# Patient Record
Sex: Male | Born: 1990 | Race: White | Hispanic: No | Marital: Single | Smoking: Former smoker
Health system: Southern US, Community
[De-identification: ages and names within clinical notes are randomized; demographics above are authoritative.]

## PROBLEM LIST (undated history)

## (undated) DIAGNOSIS — X838XXA Intentional self-harm by other specified means, initial encounter: Secondary | ICD-10-CM

## (undated) DIAGNOSIS — J45909 Unspecified asthma, uncomplicated: Secondary | ICD-10-CM

## (undated) DIAGNOSIS — F32A Depression, unspecified: Secondary | ICD-10-CM

## (undated) DIAGNOSIS — F329 Major depressive disorder, single episode, unspecified: Secondary | ICD-10-CM

## (undated) HISTORY — PX: LEG SURGERY: SHX1003

## (undated) HISTORY — PX: CHOLECYSTECTOMY: SHX55

---
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
## 2010-04-13 ENCOUNTER — Emergency Department (HOSPITAL_BASED_OUTPATIENT_CLINIC_OR_DEPARTMENT_OTHER): Admission: EM | Admit: 2010-04-13 | Discharge: 2010-04-13 | Payer: Self-pay | Admitting: Emergency Medicine

## 2010-04-13 ENCOUNTER — Ambulatory Visit: Payer: Self-pay | Admitting: Diagnostic Radiology

## 2010-04-30 ENCOUNTER — Emergency Department (HOSPITAL_COMMUNITY): Admission: EM | Admit: 2010-04-30 | Discharge: 2010-04-30 | Payer: Self-pay | Admitting: Emergency Medicine

## 2017-05-30 ENCOUNTER — Encounter (HOSPITAL_COMMUNITY): Payer: Self-pay | Admitting: Emergency Medicine

## 2017-05-30 ENCOUNTER — Emergency Department (HOSPITAL_COMMUNITY): Payer: BLUE CROSS/BLUE SHIELD

## 2017-05-30 ENCOUNTER — Emergency Department (HOSPITAL_COMMUNITY)
Admission: EM | Admit: 2017-05-30 | Discharge: 2017-05-30 | Disposition: A | Discharge status: Home / Self Care | Payer: BLUE CROSS/BLUE SHIELD | Attending: Emergency Medicine | Admitting: Emergency Medicine

## 2017-05-30 DIAGNOSIS — F17290 Nicotine dependence, other tobacco product, uncomplicated: Secondary | ICD-10-CM | POA: Insufficient documentation

## 2017-05-30 DIAGNOSIS — R630 Anorexia: Secondary | ICD-10-CM | POA: Insufficient documentation

## 2017-05-30 DIAGNOSIS — R1084 Generalized abdominal pain: Secondary | ICD-10-CM | POA: Insufficient documentation

## 2017-05-30 DIAGNOSIS — R110 Nausea: Secondary | ICD-10-CM | POA: Insufficient documentation

## 2017-05-30 DIAGNOSIS — K6389 Other specified diseases of intestine: Secondary | ICD-10-CM

## 2017-05-30 DIAGNOSIS — K388 Other specified diseases of appendix: Secondary | ICD-10-CM | POA: Insufficient documentation

## 2017-05-30 DIAGNOSIS — K529 Noninfective gastroenteritis and colitis, unspecified: Secondary | ICD-10-CM

## 2017-05-30 DIAGNOSIS — R109 Unspecified abdominal pain: Secondary | ICD-10-CM | POA: Diagnosis present

## 2017-05-30 HISTORY — DX: Unspecified asthma, uncomplicated: J45.909

## 2017-05-30 LAB — COMPREHENSIVE METABOLIC PANEL
ALT: 76 U/L — AB (ref 17–63)
AST: 34 U/L (ref 15–41)
Albumin: 4.3 g/dL (ref 3.5–5.0)
Alkaline Phosphatase: 50 U/L (ref 38–126)
Anion gap: 7 (ref 5–15)
BILIRUBIN TOTAL: 0.6 mg/dL (ref 0.3–1.2)
BUN: 18 mg/dL (ref 6–20)
CALCIUM: 9.3 mg/dL (ref 8.9–10.3)
CHLORIDE: 105 mmol/L (ref 101–111)
CO2: 26 mmol/L (ref 22–32)
CREATININE: 1.15 mg/dL (ref 0.61–1.24)
Glucose, Bld: 103 mg/dL — ABNORMAL HIGH (ref 65–99)
Potassium: 3.8 mmol/L (ref 3.5–5.1)
Sodium: 138 mmol/L (ref 135–145)
TOTAL PROTEIN: 7.2 g/dL (ref 6.5–8.1)

## 2017-05-30 LAB — CBC
HCT: 43.1 % (ref 39.0–52.0)
Hemoglobin: 15.1 g/dL (ref 13.0–17.0)
MCH: 30.5 pg (ref 26.0–34.0)
MCHC: 35 g/dL (ref 30.0–36.0)
MCV: 87.1 fL (ref 78.0–100.0)
PLATELETS: 152 10*3/uL (ref 150–400)
RBC: 4.95 MIL/uL (ref 4.22–5.81)
RDW: 12.3 % (ref 11.5–15.5)
WBC: 8.8 10*3/uL (ref 4.0–10.5)

## 2017-05-30 LAB — URINALYSIS, ROUTINE W REFLEX MICROSCOPIC
Bilirubin Urine: NEGATIVE
Glucose, UA: NEGATIVE mg/dL
Hgb urine dipstick: NEGATIVE
KETONES UR: NEGATIVE mg/dL
LEUKOCYTES UA: NEGATIVE
NITRITE: NEGATIVE
PROTEIN: NEGATIVE mg/dL
Specific Gravity, Urine: 1.016 (ref 1.005–1.030)
pH: 6 (ref 5.0–8.0)

## 2017-05-30 LAB — I-STAT CG4 LACTIC ACID, ED: LACTIC ACID, VENOUS: 0.74 mmol/L (ref 0.5–1.9)

## 2017-05-30 LAB — LIPASE, BLOOD: LIPASE: 30 U/L (ref 11–51)

## 2017-05-30 MED ORDER — IOPAMIDOL (ISOVUE-300) INJECTION 61%
INTRAVENOUS | Status: AC
  Filled 2017-05-30: qty 100

## 2017-05-30 MED ORDER — IOPAMIDOL (ISOVUE-300) INJECTION 61%
100.0000 mL | Freq: Once | INTRAVENOUS | Status: AC | PRN
Start: 2017-05-30 — End: 2017-05-30
  Administered 2017-05-30: 100 mL via INTRAVENOUS

## 2017-05-30 MED ORDER — OXYCODONE HCL 5 MG PO TABS: 5.0000 mg | ORAL_TABLET | ORAL | 0 refills | Status: DC | PRN

## 2017-05-30 MED ORDER — HYDROMORPHONE HCL 1 MG/ML IJ SOLN
1.0000 mg | Freq: Once | INTRAMUSCULAR | Status: AC
  Administered 2017-05-30: 1 mg via INTRAVENOUS
  Filled 2017-05-30: qty 1

## 2017-05-30 MED ORDER — ONDANSETRON HCL 4 MG/2ML IJ SOLN
4.0000 mg | Freq: Once | INTRAMUSCULAR | Status: AC
  Administered 2017-05-30: 4 mg via INTRAVENOUS
  Filled 2017-05-30: qty 2

## 2017-05-30 MED ORDER — ONDANSETRON HCL 4 MG PO TABS: 4.0000 mg | ORAL_TABLET | Freq: Three times a day (TID) | ORAL | 0 refills | Status: DC | PRN

## 2017-05-30 MED ORDER — SODIUM CHLORIDE 0.9 % IV BOLUS (SEPSIS)
1000.0000 mL | Freq: Once | INTRAVENOUS | Status: AC
  Administered 2017-05-30: 1000 mL via INTRAVENOUS

## 2017-05-30 NOTE — ED Triage Notes (Signed)
Pt states he has had lower abd pain since this morning. Denies N/V/D. Alert and oriented.

## 2017-05-30 NOTE — Discharge Instructions (Signed)
Your workup today shows evidence of epiploic appendigitis. Please use the pain and nausea medicine to help with her symptoms. Please call to follow-up with a primary care physician in the next few weeks for reassessment and reevaluation of your liver function. If any symptoms change or worsen, please return to the nearest emergency department.

## 2017-05-30 NOTE — ED Notes (Signed)
ED Provider at bedside. 

## 2017-05-30 NOTE — ED Provider Notes (Signed)
[facility]-EMERGENCY DEPT Provider Note   CSN: [id number] Arrival date & time: 05/30/17  0710     History   Chief Complaint Chief Complaint  Patient presents with  . Abdominal Pain    HPI Richard Roberson is a 26 y.o. male.  The history is provided by the patient, a friend and medical records.  Abdominal Pain   This is a new problem. The current episode started 2 days ago. The problem occurs constantly. The problem has been rapidly worsening. The pain is located in the RLQ. The quality of the pain is sharp and aching. The pain is at a severity of 10/10. The pain is severe. Associated symptoms include anorexia, nausea and constipation. Pertinent negatives include fever, diarrhea, melena, vomiting, dysuria, frequency, hematuria and headaches. The symptoms are aggravated by palpation. Nothing relieves the symptoms. Past medical history comments: prior cholecystectomy.    No past medical history on file.  There are no active problems to display for this patient.   Past Surgical History:  Procedure Laterality Date  . CHOLECYSTECTOMY    . LEG SURGERY Right        Home Medications    Prior to Admission medications   Not on File    Family History No family history on file.  Social History Social History  Substance Use Topics  . Smoking status: Current Every Day Smoker    Types: E-cigarettes  . Smokeless tobacco: Not on file  . Alcohol use Yes     Allergies   Betadine [povidone iodine]   Review of Systems Review of Systems  Constitutional: Negative for chills, diaphoresis, fatigue and fever.  HENT: Negative for congestion and rhinorrhea.   Respiratory: Negative for cough, chest tightness, shortness of breath, wheezing and stridor.   Cardiovascular: Negative for chest pain and palpitations.  Gastrointestinal: Positive for abdominal pain, anorexia, constipation and nausea. Negative for diarrhea, melena and vomiting.  Genitourinary: Negative for dysuria, flank  pain, frequency and hematuria.  Musculoskeletal: Negative for back pain, neck pain and neck stiffness.  Skin: Negative for rash and wound.  Neurological: Negative for light-headedness and headaches.  Psychiatric/Behavioral: Negative for agitation and confusion.  All other systems reviewed and are negative.    Physical Exam Updated Vital Signs BP 119/83 (BP Location: Right Arm)   Pulse 84   Temp 98.8 F (37.1 C) (Oral)   Resp 16   SpO2 95%   Physical Exam  Constitutional: He is oriented to person, place, and time. He appears well-developed and well-nourished. No distress.  HENT:  Head: Normocephalic and atraumatic.  Mouth/Throat: Oropharynx is clear and moist. No oropharyngeal exudate.  Eyes: Conjunctivae are normal.  Neck: Normal range of motion. Neck supple.  Cardiovascular: Normal rate and intact distal pulses.   No murmur heard. Pulmonary/Chest: Effort normal and breath sounds normal. No respiratory distress. He has no wheezes. He exhibits no tenderness.  Abdominal: Soft. Normal appearance. There is tenderness in the right lower quadrant. There is no rigidity and no CVA tenderness.    Musculoskeletal: He exhibits no edema or tenderness.  Neurological: He is alert and oriented to person, place, and time. No sensory deficit. He exhibits normal muscle tone.  Skin: Skin is warm and dry. Capillary refill takes less than 2 seconds. He is not diaphoretic. No erythema. No pallor.  Psychiatric: He has a normal mood and affect.  Nursing note and vitals reviewed.    ED Treatments / Results  Labs (all labs ordered are listed, but only abnormal results are  displayed) Labs Reviewed  COMPREHENSIVE METABOLIC PANEL - Abnormal; Notable for the following:       Result Value   Glucose, Bld 103 (*)    ALT 76 (*)    All other components within normal limits  URINE CULTURE  LIPASE, BLOOD  CBC  URINALYSIS, ROUTINE W REFLEX MICROSCOPIC  I-STAT CG4 LACTIC ACID, ED    EKG  EKG  Interpretation None       Radiology Ct Abdomen Pelvis W Contrast  Result Date: 05/30/2017 CLINICAL DATA:  RIGHT lower quadrant pain across lower abdomen. Syncopal episode for the spleen. Surgical history including cholecystectomy and hernia repair. EXAM: CT ABDOMEN AND PELVIS WITH CONTRAST TECHNIQUE: Multidetector CT imaging of the abdomen and pelvis was performed using the standard protocol following bolus administration of intravenous contrast. CONTRAST:  ISOVUE-300 IOPAMIDOL (ISOVUE-300) INJECTION 61% COMPARISON:  CT 01/01/2016 FINDINGS: Lower chest: Lung bases are clear. Hepatobiliary: No focal hepatic lesion. Postcholecystectomy. No biliary dilatation. Low-attenuation within liver could indicate hepatic steatosis. Pancreas: Pancreas is normal. No ductal dilatation. No pancreatic inflammation. Spleen: Normal spleen Adrenals/urinary tract: Adrenal glands and kidneys are normal. The ureters and bladder normal. Stomach/Bowel: Stomach, small-bowel, terminal ileum, and appendix are normal. The ascending, transverse and descending colon a normal. Along the anti mesenteric border of the proximal sigmoid colon there is an ovoid region of fat enhancement measuring 20 mm by 9 mm (image 69, series 2). Lesion Is also seen on coronal image 33, series 3. This finding is most suggestive of an inflamed epiploic appendage. No diverticular disease. No perforation or abscess Vascular/Lymphatic: Abdominal aorta is normal caliber. There is no retroperitoneal or periportal lymphadenopathy. No pelvic lymphadenopathy. Reproductive: Prostate normal Other: No hernia. Musculoskeletal: No aggressive osseous lesion. IMPRESSION: 1. Acute epiploic appendagitis of the proximal sigmoid colon. This is a benign self-limiting process. 2. Normal appendix. 3. Potential hepatic steatosis. Electronically Signed   By: Genevive Bi M.D.   On: 05/30/2017 10:47    Procedures Procedures (including critical care time)  Medications  Ordered in ED Medications  HYDROmorphone (DILAUDID) injection 1 mg (1 mg Intravenous Given 05/30/17 1006)  ondansetron (ZOFRAN) injection 4 mg (4 mg Intravenous Given 05/30/17 1006)  sodium chloride 0.9 % bolus 1,000 mL (0 mLs Intravenous Stopped 05/30/17 1129)  iopamidol (ISOVUE-300) 61 % injection 100 mL (100 mLs Intravenous Contrast Given 05/30/17 1027)     Initial Impression / Assessment and Plan / ED Course  I have reviewed the triage vital signs and the nursing notes.  Pertinent labs & imaging results that were available during my care of the patient were reviewed by me and considered in my medical decision making (see chart for details).     Richard Roberson is a 26 y.o. male with a past medical history significant for asthma and prior cholecystectomy who presents with right lower quadrant abdominal pain. Patient says that for the last 3 days, as he is had worsening right lower quadrant pain. He says that he has had some constipation for the last few days and had one small bowel movement but has continued to pass normal gas. He reports nausea but no vomiting. He reports decreased appetite. He is properly concerned about appendicitis. Patient says that he feels like his pain is "something exploding". He describes as a sharp and aching pain as well. He describes as a 10 out of 10 severity and nonradiating. He denies history of kidney stones. He denies any dysuria, hematuria, or foul-smelling urine. He denies recent dramatic injuries. Denies  fevers or chills or any other symptoms.  On exam, patient has right lower quadrant and suprapubic tenderness. He denies any complaint in his groin. No CVA tenderness or chest tenderness. Lungs clear.  Patient will have laboratory testing as well as urinalysis. He'll also be given pain medicine, nausea medicine, and fluids. Patient will have CT imaging to look for appendicitis versus diverticulitis versus constipation and obstruction.  Anticipate  reassessment following workup.    11:14 AM CT imaging resulted showing evidence of epiploic appendigitis. No evidence of appendicitis. Laboratory testing was reassuring aside from slight elevation ALT. Imaging also showed possible hepatic steatosis. No evidence of UTI or infection. Normal lactic acid.  Patient reported feeling better with fluids, pain medicine, nausea medicine.   Patient will be given prescription for pain medicine and nausea medicine with this acute finding.   Patient given instructions for PCP follow-up as well as return precautions for any new or worsening symptoms. Patient will be discharged and understands plan of care. Patient discharged in good condition.    Final Clinical Impressions(s) / ED Diagnoses   Final diagnoses:  Generalized abdominal pain  Epiploic appendagitis    New Prescriptions New Prescriptions   ONDANSETRON (ZOFRAN) 4 MG TABLET    Take 1 tablet (4 mg total) by mouth every 8 (eight) hours as needed for nausea or vomiting.   OXYCODONE (ROXICODONE) 5 MG IMMEDIATE RELEASE TABLET    Take 1 tablet (5 mg total) by mouth every 4 (four) hours as needed for severe pain.    Clinical Impression: 1. Generalized abdominal pain   2. Epiploic appendagitis     Disposition: Discharge  Condition: Good  I have discussed the results, Dx and Tx plan with the pt(& family if present). He/she/they expressed understanding and agree(s) with the plan. Discharge instructions discussed at great length. Strict return precautions discussed and pt &/or family have verbalized understanding of the instructions. No further questions at time of discharge.    New Prescriptions   No medications on file    Follow Up: No follow-up provider specified.     Ran Tullis, Canary Brim, MD 05/30/17 2032

## 2017-05-31 LAB — URINE CULTURE: Culture: <10000 — AB

## 2017-11-14 ENCOUNTER — Emergency Department (HOSPITAL_COMMUNITY)
Admission: EM | Admit: 2017-11-14 | Discharge: 2017-11-14 | Disposition: A | Discharge status: Home / Self Care | Payer: BLUE CROSS/BLUE SHIELD | Attending: Emergency Medicine | Admitting: Emergency Medicine

## 2017-11-14 ENCOUNTER — Encounter (HOSPITAL_COMMUNITY): Payer: Self-pay

## 2017-11-14 ENCOUNTER — Other Ambulatory Visit: Payer: Self-pay

## 2017-11-14 DIAGNOSIS — F17200 Nicotine dependence, unspecified, uncomplicated: Secondary | ICD-10-CM | POA: Insufficient documentation

## 2017-11-14 DIAGNOSIS — F329 Major depressive disorder, single episode, unspecified: Secondary | ICD-10-CM | POA: Insufficient documentation

## 2017-11-14 DIAGNOSIS — R45851 Suicidal ideations: Secondary | ICD-10-CM | POA: Insufficient documentation

## 2017-11-14 HISTORY — DX: Major depressive disorder, single episode, unspecified: F32.9

## 2017-11-14 HISTORY — DX: Intentional self-harm by other specified means, initial encounter: X83.8XXA

## 2017-11-14 HISTORY — DX: Depression, unspecified: F32.A

## 2017-11-14 LAB — COMPREHENSIVE METABOLIC PANEL
ALBUMIN: 4.2 g/dL (ref 3.5–5.0)
ALT: 107 U/L — AB (ref 17–63)
AST: 47 U/L — ABNORMAL HIGH (ref 15–41)
Alkaline Phosphatase: 57 U/L (ref 38–126)
Anion gap: 9 (ref 5–15)
BUN: 17 mg/dL (ref 6–20)
CHLORIDE: 104 mmol/L (ref 101–111)
CO2: 24 mmol/L (ref 22–32)
CREATININE: 1 mg/dL (ref 0.61–1.24)
Calcium: 9.1 mg/dL (ref 8.9–10.3)
GFR calc Af Amer: >60 mL/min (ref >60)
GFR calc non Af Amer: >60 mL/min (ref >60)
Glucose, Bld: 132 mg/dL — ABNORMAL HIGH (ref 65–99)
Potassium: 3.6 mmol/L (ref 3.5–5.1)
SODIUM: 137 mmol/L (ref 135–145)
Total Bilirubin: 0.8 mg/dL (ref 0.3–1.2)
Total Protein: 7.2 g/dL (ref 6.5–8.1)

## 2017-11-14 LAB — CBC
HCT: 44.3 % (ref 39.0–52.0)
HEMOGLOBIN: 15.4 g/dL (ref 13.0–17.0)
MCH: 30.9 pg (ref 26.0–34.0)
MCHC: 34.8 g/dL (ref 30.0–36.0)
MCV: 88.8 fL (ref 78.0–100.0)
Platelets: 152 10*3/uL (ref 150–400)
RBC: 4.99 MIL/uL (ref 4.22–5.81)
RDW: 12.7 % (ref 11.5–15.5)
WBC: 9.7 10*3/uL (ref 4.0–10.5)

## 2017-11-14 LAB — RAPID URINE DRUG SCREEN, HOSP PERFORMED
AMPHETAMINES: NOT DETECTED
Barbiturates: NOT DETECTED
Benzodiazepines: NOT DETECTED
COCAINE: NOT DETECTED
Opiates: NOT DETECTED
TETRAHYDROCANNABINOL: NOT DETECTED

## 2017-11-14 LAB — ETHANOL: Alcohol, Ethyl (B): <10 mg/dL (ref <10)

## 2017-11-14 LAB — ACETAMINOPHEN LEVEL

## 2017-11-14 LAB — SALICYLATE LEVEL: Salicylate Lvl: <7 mg/dL (ref 2.8–30.0)

## 2017-11-14 MED ORDER — HYDROXYZINE HCL 25 MG PO TABS: 25.0000 mg | ORAL_TABLET | Freq: Three times a day (TID) | ORAL | Status: DC | PRN

## 2017-11-14 MED ORDER — NICOTINE 21 MG/24HR TD PT24
21.0000 mg | MEDICATED_PATCH | Freq: Once | TRANSDERMAL | Status: DC
  Administered 2017-11-14: 21 mg via TRANSDERMAL
  Filled 2017-11-14: qty 1

## 2017-11-14 MED ORDER — HYDROXYZINE HCL 25 MG PO TABS: 25.0000 mg | ORAL_TABLET | Freq: Three times a day (TID) | ORAL | 0 refills | Status: DC | PRN

## 2017-11-14 NOTE — ED Provider Notes (Signed)
[facility]-EMERGENCY DEPT Provider Note   CSN: [id number] Arrival date & time: 11/14/17  [id number]     History   Chief Complaint Chief Complaint  Patient presents with  . Suicidal    HPI Richard BullaChristopher H Roberson is a 27 y.o. male.  Patient is a 27 year old male with past medical history of depression.  He is brought by authorities for evaluation of depression with suicidal ideation.  He reports stressors in his life including finances, job responsibilities, and relationship issues as stressors.  He states he has thought of taking a knife and cutting his wrists.  He denies any homicidal ideation.  He denies recent drug use, but does have a history of polysubstance abuse.  He admits to infrequent alcohol use.   The history is provided by the patient.    Past Medical History:  Diagnosis Date  . Asthma    as a child  . Depression   . Suicide (HCC)     There are no active problems to display for this patient.   Past Surgical History:  Procedure Laterality Date  . CHOLECYSTECTOMY    . LEG SURGERY Right        Home Medications    Prior to Admission medications   Medication Sig Start Date End Date Taking? Authorizing Provider  calcium carbonate (TUMS - DOSED IN MG ELEMENTAL CALCIUM) 500 MG chewable tablet Chew 1-2 tablets by mouth as needed for indigestion or heartburn.    [provider]  ibuprofen (ADVIL,MOTRIN) 200 MG tablet Take 800 mg by mouth every 6 (six) hours as needed for fever, headache, mild pain, moderate pain or cramping.    [provider]  ondansetron (ZOFRAN) 4 MG tablet Take 1 tablet (4 mg total) by mouth every 8 (eight) hours as needed for nausea or vomiting. 05/30/17   Tegeler, Canary Brimhristopher J, MD  oxyCODONE (ROXICODONE) 5 MG immediate release tablet Take 1 tablet (5 mg total) by mouth every 4 (four) hours as needed for severe pain. 05/30/17   Tegeler, Canary Brimhristopher J, MD    Family History History reviewed. No pertinent family  history.  Social History Social History   Tobacco Use  . Smoking status: Current Every Day Smoker    Types: E-cigarettes  . Smokeless tobacco: Never Used  Substance Use Topics  . Alcohol use: Yes  . Drug use: Yes    Types: Cocaine, Marijuana     Allergies   Betadine [povidone iodine]   Review of Systems Review of Systems  All other systems reviewed and are negative.    Physical Exam Updated Vital Signs BP (!) 154/111 (BP Location: Left Arm)   Pulse 81   Temp 98.7 F (37.1 C) (Oral)   Resp 20   Ht 5\' 9"  (1.753 m)   Wt 104.3 kg (230 lb)   SpO2 98%   BMI 33.97 kg/m   Physical Exam  Constitutional: He is oriented to person, place, and time. He appears well-developed and well-nourished. No distress.  HENT:  Head: Normocephalic and atraumatic.  Mouth/Throat: Oropharynx is clear and moist.  Neck: Normal range of motion. Neck supple.  Cardiovascular: Normal rate and regular rhythm. Exam reveals no friction rub.  No murmur heard. Pulmonary/Chest: Effort normal and breath sounds normal. No respiratory distress. He has no wheezes. He has no rales.  Abdominal: Soft. Bowel sounds are normal. He exhibits no distension. There is no tenderness.  Musculoskeletal: Normal range of motion. He exhibits no edema.  Neurological: He is alert and oriented  to person, place, and time. Coordination normal.  Skin: Skin is warm and dry. He is not diaphoretic.  Psychiatric: His speech is normal and behavior is normal. His mood appears not anxious. He is not actively hallucinating. Cognition and memory are normal. He expresses impulsivity. He exhibits a depressed mood. He expresses suicidal ideation. He expresses suicidal plans. He is attentive.  Nursing note and vitals reviewed.    ED Treatments / Results  Labs (all labs ordered are listed, but only abnormal results are displayed) Labs Reviewed  COMPREHENSIVE METABOLIC PANEL - Abnormal; Notable for the following components:      Result  Value   Glucose, Bld 132 (*)    AST 47 (*)    ALT 107 (*)    All other components within normal limits  CBC  ETHANOL  SALICYLATE LEVEL  ACETAMINOPHEN LEVEL  RAPID URINE DRUG SCREEN, HOSP PERFORMED    EKG  EKG Interpretation None       Radiology No results found.  Procedures Procedures (including critical care time)  Medications Ordered in ED Medications - No data to display   Initial Impression / Assessment and Plan / ED Course  I have reviewed the triage vital signs and the nursing notes.  Pertinent labs & imaging results that were available during my care of the patient were reviewed by me and considered in my medical decision making (see chart for details).  Patient to be evaluated by TTS.  Patient will likely require inpatient treatment.  Final Clinical Impressions(s) / ED Diagnoses   Final diagnoses:  None    ED Discharge Orders    None       Geoffery Lyons, MD 11/14/17 [phone]

## 2017-11-14 NOTE — ED Notes (Addendum)
Patient arrived to unit and is calm and cooperative with care. Pt pleasant and answering questions appropriately, but does endorse depression. Pt states "I have been unable to afford my depression medication for quite a while and I just need help getting them. I work full-time and I am not able to cope with this anymore. I would like outpatient help; I don't feel like I need to be admitted". Pt verbally contracts for safety at this time, but states he has had passive thoughts to cut himself. No distress noted. Sitter at bedside for safety. Dr. Judd Lienelo in room to assess patient currently.

## 2017-11-14 NOTE — BH Assessment (Signed)
[facility]  Per Laveda AbbeLaurie Britton Parks, FNP, this pt does not require psychiatric hospitalization at this time.  Pt is to be discharged with referral information for outpatient psychiatrists and therapists.  Discharge instructions include referral information for the [facility] in [place], for the [facility], and for [facility] [place].  Pt's nurse, Toni AmendCourtney, has been notified.  Doylene Canninghomas Brie Eppard, MA Triage Specialist [phone]

## 2017-11-14 NOTE — ED Triage Notes (Signed)
States has been off psych medication for 10 years unable to afford medication now wants to hurt wanted to get knife and cut wrists.

## 2017-11-14 NOTE — Discharge Instructions (Signed)
For your behavioral health needs, you are advised to follow up with outpatient psychiatry and therapy.  Contact one of the following providers today to ask about scheduling an intake appointment:       [facility]      [address]      [place], [place] [address]      [phone]       [facility]      [phone] [address]      [place], [place] [address]      [phone]       [facility]      [address]      [place], [place] [address]      [phone]

## 2017-11-14 NOTE — BH Assessment (Signed)
[facility] Assessment Progress Note  Case was staffed with Arville CareParks FNP who recommended patient be discharged with OP resources.

## 2017-11-14 NOTE — BH Assessment (Addendum)
Assessment Note  Richard Roberson is an 27 y.o. male that presents this date with his partner who renders collateral information with patient's permission. Patient denies any S/I, H/I or AVH. Patient reports that he went to work earlier this date and started feeling "very nervous" to the point that his employer asked patient to present to a local provider for a psychiatric evaluation. Patient reports that he has been feeling "very overwhelmed" the last few weeks due to stress at work and trying to "plan his marriage" to his current partner who is present during evaluation. Patient states he went to the 11 th grade failing to graduate high school and feels he may be suffering from anxiety due to not being able to provide for his fiancee. Patient reports current symptoms to include: excessive anxiety, guilt, decreased sleep (patient states he has not slept in the last two days) and feelings of being worthless. 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 Patient denies any past history of SA use. Patient presents with a pleasant affect and is oriented to time/place. Patient denies any previous attempts/gestures at self harm .Patient did reported he attempted to "drink himself to death" one night four years ago although denies that was a suicide attempt. Patient would not elaborate on that incident but reports it was associated with a "bad relationship." Patient denies any prior inpatient/OP providers or hospitilations. Patient did report that he was diagnosed with ADHD at age 30 and was briefly seen at [facility] although cannot recall what that intervention was or if he was prescribed medication/s. Per notes, patient has a past medical history of anxiety. Patient per note review initially presented with S/I with a plan to "cut his wrists" but denies during assessment. Patient stated he thinks he was "misunderstood" and would "never do that."  Patient is requesting a list of OP providers and requesting to be discharged this date. Case was staffed with Arville Care FNP who recommended patient be discharged with OP resources.         Diagnosis: F41.9 GAD   Past Medical History:  Past Medical History:  Diagnosis Date  . Asthma    as a child  . Depression   . Suicide [facility])     Past Surgical History:  Procedure Laterality Date  . CHOLECYSTECTOMY    . LEG SURGERY Right     Family History: History reviewed. No pertinent family history.  Social History:  reports that he has been smoking e-cigarettes.  he has never used smokeless tobacco. He reports that he drinks alcohol. He reports that he uses drugs. Drugs: Cocaine and Marijuana.  Additional Social History:  Alcohol / Drug Use Pain Medications: See MAR Prescriptions: See MAR Over the Counter: See MAR History of alcohol / drug use?: No history of alcohol / drug abuse(Currently maintaining sobriety ) Longest period of sobriety (when/how long): NA Negative Consequences of Use: (NA) Withdrawal Symptoms: (NA)  CIWA: CIWA-Ar BP: 110/69 Pulse Rate: 79 COWS:    Allergies:  Allergies  Allergen Reactions  . Betadine [Povidone Iodine] Other (See Comments)    Had surgery and incision got infected whiling healing with cast on     Home Medications:  (Not in a hospital admission)  OB/GYN Status:  No LMP for male patient.  General Assessment Data Location of Assessment: [facility] ED TTS Assessment: In system Is this a Tele or Face-to-Face Assessment?: Face-to-Face Is this an Initial Assessment or a  Re-assessment for this encounter?: Initial Assessment Marital status: Long term relationship Maiden name: NA Is patient pregnant?: No Pregnancy Status: No Living Arrangements: Spouse/significant other Can pt return to current living arrangement?: Yes Admission Status: Voluntary Is patient capable of signing voluntary admission?: Yes Referral Source: Self/Family/Friend Insurance type:  Tax adviserBC/BS  Medical Screening Exam [facility] Walk-in ONLY) Medical Exam completed: Yes  Crisis Care Plan Living Arrangements: Spouse/significant other Legal Guardian: (NA) Name of Psychiatrist: None Name of Therapist: None  Education Status Is patient currently in school?: No Current Grade: (NA) Highest grade of school patient has completed: (11) Name of school: (NA) Contact person: (NA)  Risk to self with the past 6 months Suicidal Ideation: No Has patient been a risk to self within the past 6 months prior to admission? : No Suicidal Intent: No Has patient had any suicidal intent within the past 6 months prior to admission? : No Is patient at risk for suicide?: Yes Suicidal Plan?: No Has patient had any suicidal plan within the past 6 months prior to admission? : No Access to Means: No What has been your use of drugs/alcohol within the last 12 months?: Denies current use Previous Attempts/Gestures: Yes How many times?: 1 Other Self Harm Risks: (NA) Triggers for Past Attempts: Unknown Intentional Self Injurious Behavior: None Family Suicide History: No Recent stressful life event(s): Other (Comment)(Stress at work) Persecutory voices/beliefs?: No Depression: Yes Depression Symptoms: Fatigue, Feeling worthless/self pity Substance abuse history and/or treatment for substance abuse?: No Suicide prevention information given to non-admitted patients: Not applicable  Risk to Others within the past 6 months Homicidal Ideation: No Does patient have any lifetime risk of violence toward others beyond the six months prior to admission? : No Thoughts of Harm to Others: No Current Homicidal Intent: No Current Homicidal Plan: No Access to Homicidal Means: No Identified Victim: NA History of harm to others?: No Assessment of Violence: None Noted Violent Behavior Description: NA Does patient have access to weapons?: No Criminal Charges Pending?: No Does patient have a court date: No Is  patient on probation?: No  Psychosis Hallucinations: None noted Delusions: None noted  Mental Status Report Motor Activity: Freedom of movement, Unremarkable     ADLScreening [facility] Assessment Services) Patient's cognitive ability adequate to safely complete daily activities?: Yes Patient able to express need for assistance with ADLs?: Yes Independently performs ADLs?: Yes (appropriate for developmental age)  Prior Inpatient Therapy Prior Inpatient Therapy: No     ADL Screening (condition at time of admission) Patient's cognitive ability adequate to safely complete daily activities?: Yes Is the patient deaf or have difficulty hearing?: No Does the patient have difficulty seeing, even when wearing glasses/contacts?: No Does the patient have difficulty concentrating, remembering, or making decisions?: No Patient able to express need for assistance with ADLs?: Yes Does the patient have difficulty dressing or bathing?: No Independently performs ADLs?: Yes (appropriate for developmental age) Does the patient have difficulty walking or climbing stairs?: No Weakness of Legs: None Weakness of Arms/Hands: None  Home Assistive Devices/Equipment Home Assistive Devices/Equipment: None  Therapy Consults (therapy consults require a physician order) PT Evaluation Needed: No OT Evalulation Needed: No SLP Evaluation Needed: No Abuse/Neglect Assessment (Assessment to be complete while patient is alone) Physical Abuse: Denies Verbal Abuse: Denies Sexual Abuse: Denies Exploitation of patient/patient's resources: Denies Self-Neglect: Denies Values / Beliefs Cultural Requests During Hospitalization: None Spiritual Requests During Hospitalization: None Consults Spiritual Care Consult Needed: No Social Work Consult Needed: No Merchant navy officerAdvance Directives (For Healthcare) Does Patient  Have a Medical Advance Directive?: No Would patient like information on creating a medical advance directive?: No -  Patient declined    Additional Information 1:1 In Past 12 Months?: No CIRT Risk: No     Disposition: Case was staffed with Arville Care FNP who recommended patient be discharged with OP resources.           On Site Evaluation by:   Reviewed with Physician:    Alfredia Ferguson 11/14/2017 2:32 PM

## 2018-09-20 ENCOUNTER — Emergency Department (HOSPITAL_COMMUNITY)
Admission: EM | Admit: 2018-09-20 | Discharge: 2018-09-20 | Disposition: A | Discharge status: Home / Self Care | Payer: Self-pay | Attending: Emergency Medicine | Admitting: Emergency Medicine

## 2018-09-20 ENCOUNTER — Encounter (HOSPITAL_COMMUNITY): Payer: Self-pay | Admitting: Emergency Medicine

## 2018-09-20 ENCOUNTER — Emergency Department (HOSPITAL_COMMUNITY): Payer: Self-pay

## 2018-09-20 ENCOUNTER — Other Ambulatory Visit: Payer: Self-pay

## 2018-09-20 DIAGNOSIS — J45909 Unspecified asthma, uncomplicated: Secondary | ICD-10-CM | POA: Insufficient documentation

## 2018-09-20 DIAGNOSIS — Z87891 Personal history of nicotine dependence: Secondary | ICD-10-CM | POA: Insufficient documentation

## 2018-09-20 DIAGNOSIS — J209 Acute bronchitis, unspecified: Secondary | ICD-10-CM | POA: Insufficient documentation

## 2018-09-20 LAB — CBG MONITORING, ED: GLUCOSE-CAPILLARY: 76 mg/dL (ref 70–99)

## 2018-09-20 MED ORDER — ALBUTEROL SULFATE HFA 108 (90 BASE) MCG/ACT IN AERS: 1.0000 | INHALATION_SPRAY | Freq: Four times a day (QID) | RESPIRATORY_TRACT | 0 refills | Status: AC | PRN

## 2018-09-20 MED ORDER — AZITHROMYCIN 250 MG PO TABS: 250.0000 mg | ORAL_TABLET | Freq: Every day | ORAL | 0 refills | Status: AC

## 2018-09-20 NOTE — ED Provider Notes (Signed)
[facility]-EMERGENCY DEPT Provider Note   CSN: [id number] Arrival date & time: 09/20/18  1218     History   Chief Complaint Chief Complaint  Patient presents with  . Cough    HPI Richard Roberson is a 27 y.o. male.  The history is provided by the patient. No language interpreter was used.  Cough  This is a new problem. The problem occurs constantly. The cough is non-productive. There has been no fever. Associated symptoms include shortness of breath. He is a smoker. His past medical history does not include bronchitis.   Pt reports he has had a cough for over a month  Past Medical History:  Diagnosis Date  . Asthma    as a child  . Depression   . Suicide ([facility])     There are no active problems to display for this patient.   Past Surgical History:  Procedure Laterality Date  . CHOLECYSTECTOMY    . LEG SURGERY Right         Home Medications    Prior to Admission medications   Medication Sig Start Date End Date Taking? Authorizing Provider  albuterol (PROVENTIL HFA;VENTOLIN HFA) 108 (90 Base) MCG/ACT inhaler Inhale 1-2 puffs into the lungs every 6 (six) hours as needed for wheezing or shortness of breath. 09/20/18   Elson Areas, PA-C  azithromycin (ZITHROMAX) 250 MG tablet Take 1 tablet (250 mg total) by mouth daily. Take first 2 tablets together, then 1 every day until finished. 09/20/18   Elson Areas, PA-C  calcium carbonate (TUMS - DOSED IN MG ELEMENTAL CALCIUM) 500 MG chewable tablet Chew 1-2 tablets by mouth as needed for indigestion or heartburn.    [provider]  hydrOXYzine (ATARAX/VISTARIL) 25 MG tablet Take 1 tablet (25 mg total) by mouth 3 (three) times daily as needed for anxiety. 11/14/17   Laveda Abbe, NP  ibuprofen (ADVIL,MOTRIN) 200 MG tablet Take 800 mg by mouth every 6 (six) hours as needed for moderate pain.     [provider]    Family History No family history on file.  Social  History Social History   Tobacco Use  . Smoking status: Former Smoker    Types: E-cigarettes  . Smokeless tobacco: Current User  Substance Use Topics  . Alcohol use: Yes  . Drug use: Yes    Types: Cocaine, Marijuana     Allergies   Betadine [povidone iodine]   Review of Systems Review of Systems  Respiratory: Positive for cough and shortness of breath.   All other systems reviewed and are negative.    Physical Exam Updated Vital Signs There were no vitals taken for this visit.  Physical Exam  Constitutional: He appears well-developed and well-nourished.  HENT:  Head: Normocephalic and atraumatic.  Eyes: Conjunctivae are normal.  Neck: Neck supple.  Cardiovascular: Normal rate and regular rhythm.  No murmur heard. Pulmonary/Chest: Effort normal and breath sounds normal. No respiratory distress.  Abdominal: Soft. There is no tenderness.  Musculoskeletal: He exhibits no edema.  Neurological: He is alert.  Skin: Skin is warm and dry.  Psychiatric: He has a normal mood and affect.  Nursing note and vitals reviewed.    ED Treatments / Results  Labs (all labs ordered are listed, but only abnormal results are displayed) Labs Reviewed  CBG MONITORING, ED    EKG None  Radiology Dg Chest 2 View  Result Date: 09/20/2018 CLINICAL DATA:  Cough for several weeks EXAM: CHEST -  2 VIEW COMPARISON:  January 01, 2016 FINDINGS: The lungs are clear. The heart size and pulmonary vascularity are normal. No adenopathy. No bone lesions. IMPRESSION: No edema or consolidation. Electronically Signed   By: Bretta BangWilliam  Woodruff III M.D.   On: 09/20/2018 12:53    Procedures Procedures (including critical care time)  Medications Ordered in ED Medications - No data to display   Initial Impression / Assessment and Plan / ED Course  I have reviewed the triage vital signs and the nursing notes.  Pertinent labs & imaging results that were available during my care of the patient were  reviewed by me and considered in my medical decision making (see chart for details).     MDM  Chest xray normal.  Pt has a family history of diabetes.  Glucose normal   Final Clinical Impressions(s) / ED Diagnoses   Final diagnoses:  Acute bronchitis, unspecified organism    ED Discharge Orders         Ordered    azithromycin (ZITHROMAX) 250 MG tablet  Daily     09/20/18 1311    albuterol (PROVENTIL HFA;VENTOLIN HFA) 108 (90 Base) MCG/ACT inhaler  Every 6 hours PRN     09/20/18 1311        An After Visit Summary was printed and given to the patient.   Elson AreasSofia, Leslie K, PA-C 09/20/18 1315    Geoffery Lyonselo, Douglas, MD 09/20/18 1538

## 2018-09-20 NOTE — Discharge Instructions (Signed)
Return if any problems.

## 2018-09-20 NOTE — ED Notes (Signed)
Bed: WTR8 Expected date:  Expected time:  Means of arrival:  Comments: 

## 2018-09-20 NOTE — ED Triage Notes (Signed)
Patient reports cough for approximately four weeks.

## 2019-02-04 IMAGING — CT CT ABD-PELV W/ CM
2 of 4 series · 16 of 46 positions shown, 18 images · IV contrast (ISOVUE)
Comparison: CT 01/01/2016

CLINICAL DATA: RIGHT lower quadrant pain across lower abdomen.
Syncopal episode for the spleen. Surgical history including
cholecystectomy and hernia repair.

EXAM:
CT ABDOMEN AND PELVIS WITH CONTRAST
TECHNIQUE: Multidetector CT imaging of the abdomen and pelvis was performed
using the standard protocol following bolus administration of
intravenous contrast.
CONTRAST:  100mL [id number] IOPAMIDOL ([id number]) INJECTION 61%

[Series 2: abd/pel with · axial · 0.77mm/px · z∈[-576,-126]mm · 13 of 102 slices shown, 15 images]
[im 6/102  soft-tissue]
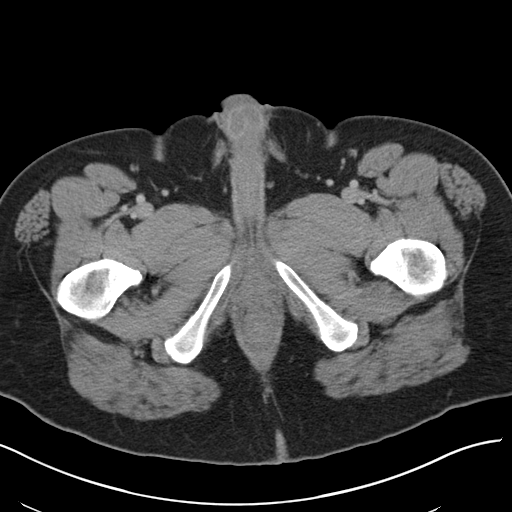
[im 6/102  bone]
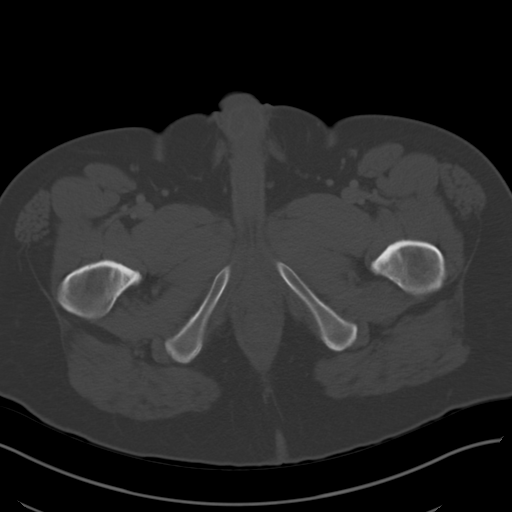
[im 16/102  soft-tissue]
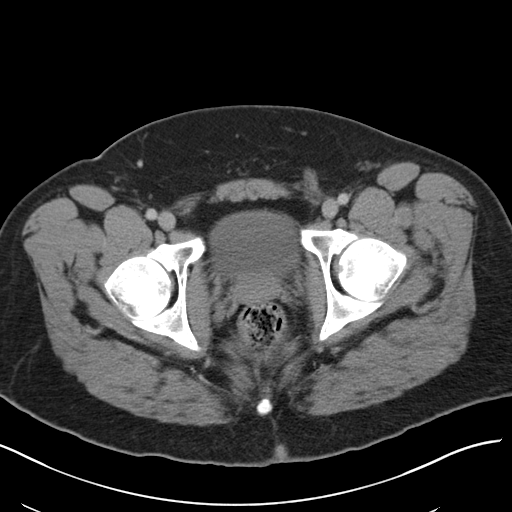
[im 22/102  soft-tissue]
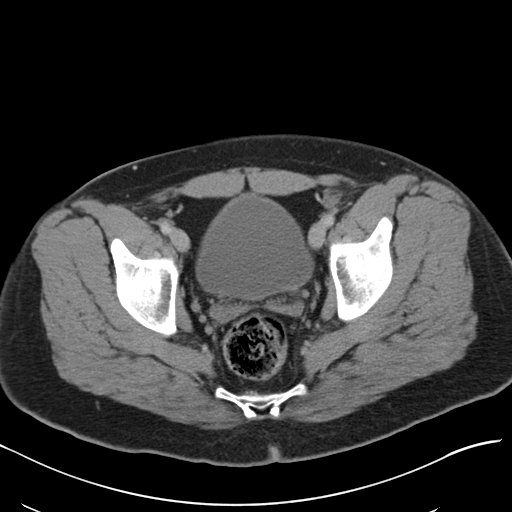
[im 27/102  soft-tissue]
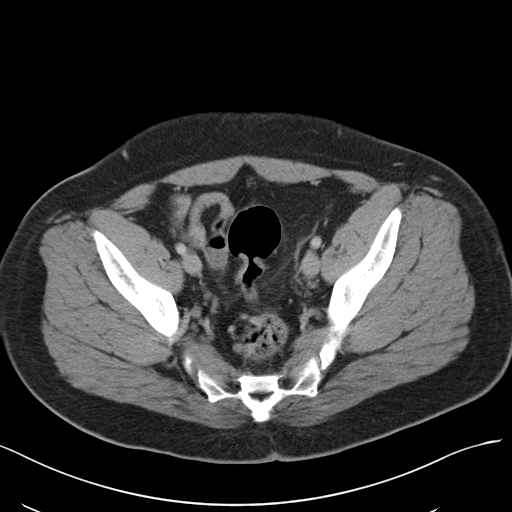
[im 38/102  soft-tissue]
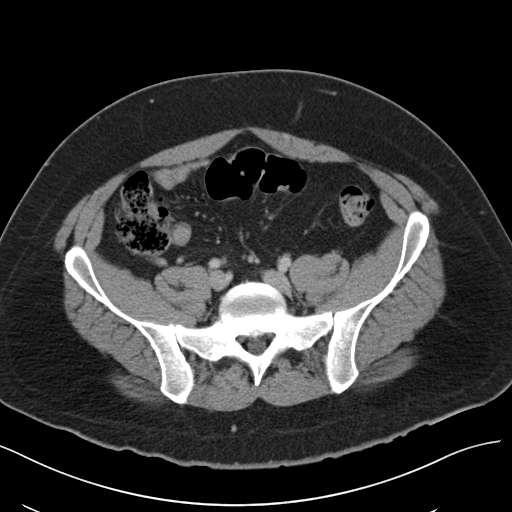
[im 43/102  soft-tissue]
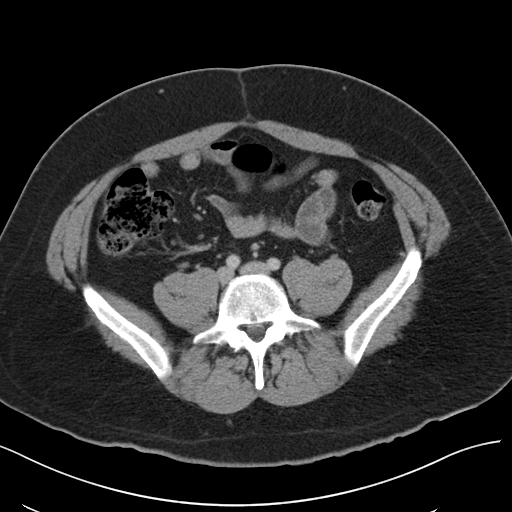
[im 54/102  soft-tissue]
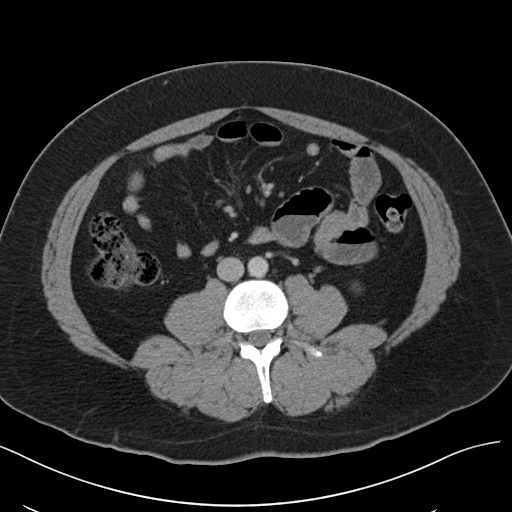
[im 59/102  soft-tissue]
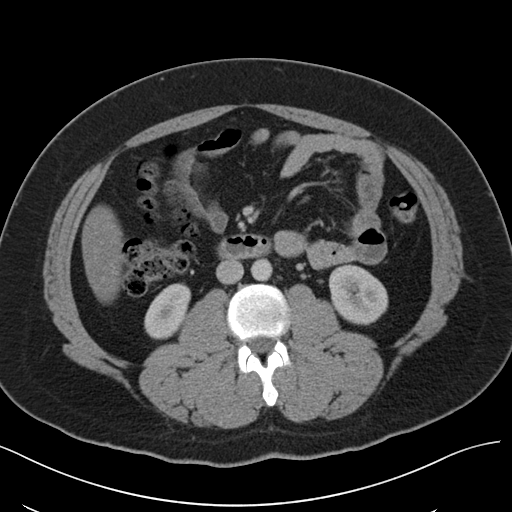
[im 64/102  soft-tissue]
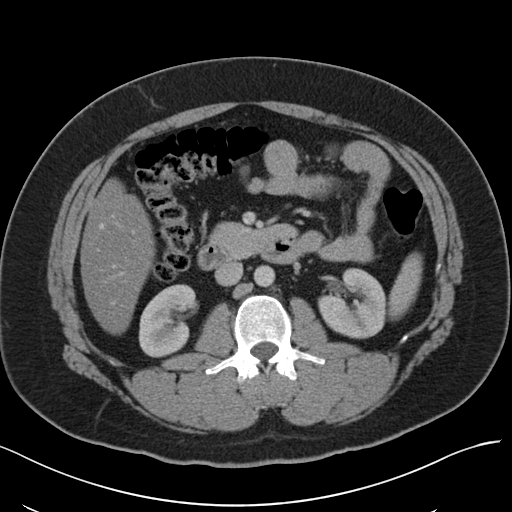
[im 64/102  bone]
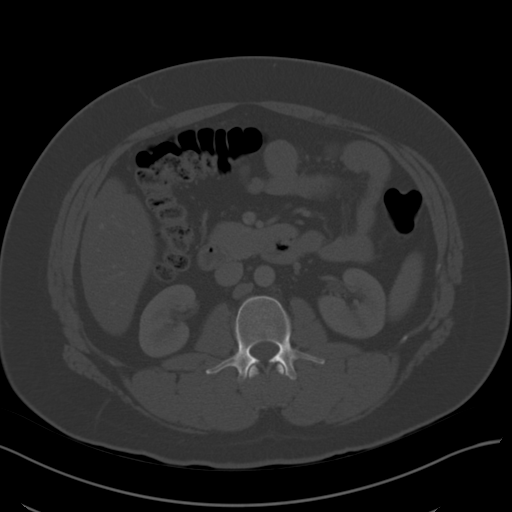
[im 75/102  soft-tissue]
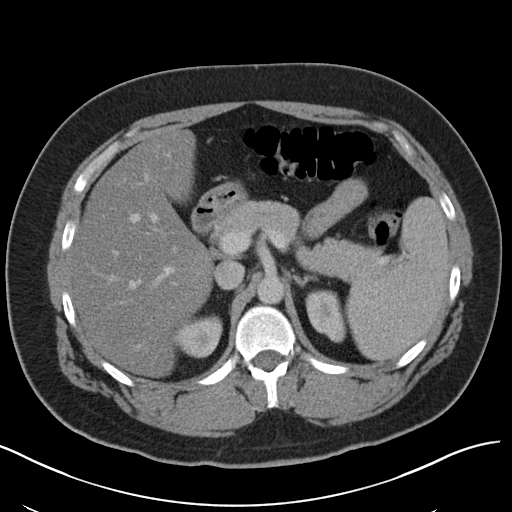
[im 80/102  soft-tissue]
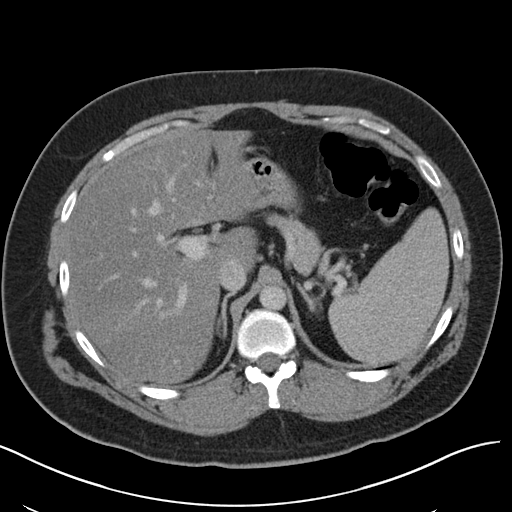
[im 86/102  soft-tissue]
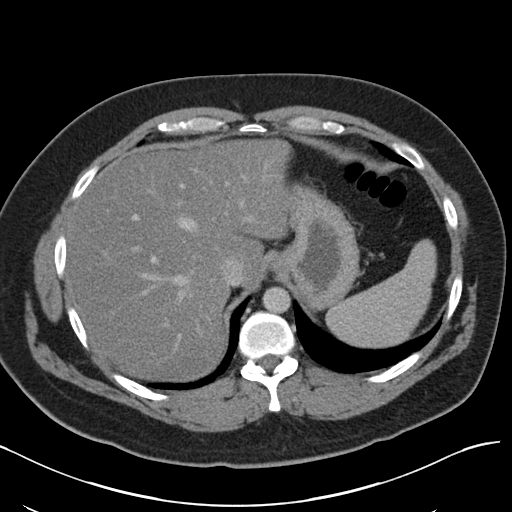
[im 96/102  soft-tissue]
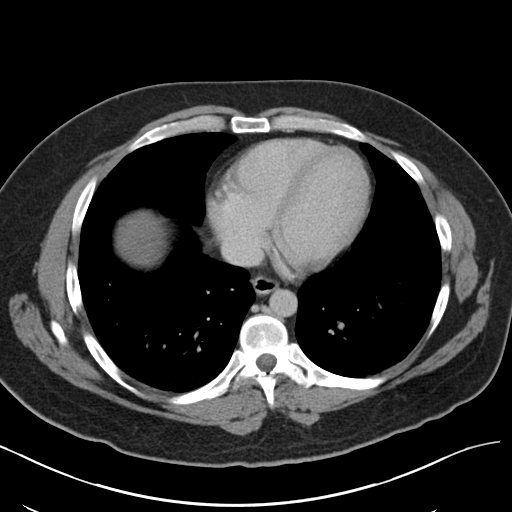

[Series 3: coronal a/|p · coronal · 0.94mm/px · 3 of 123 slices shown]
[im 41/123  soft-tissue]
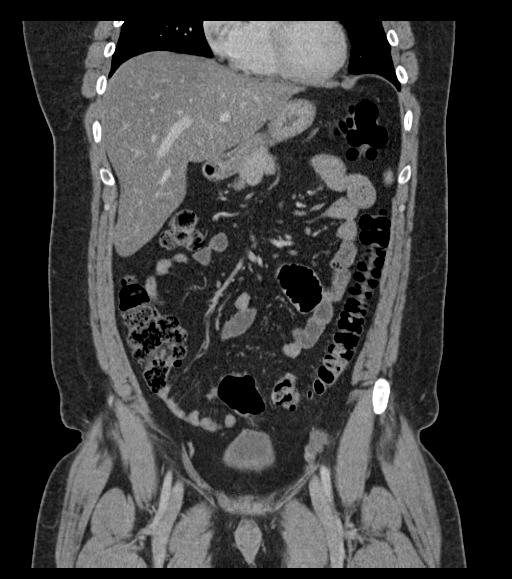
[im 55/123  soft-tissue]
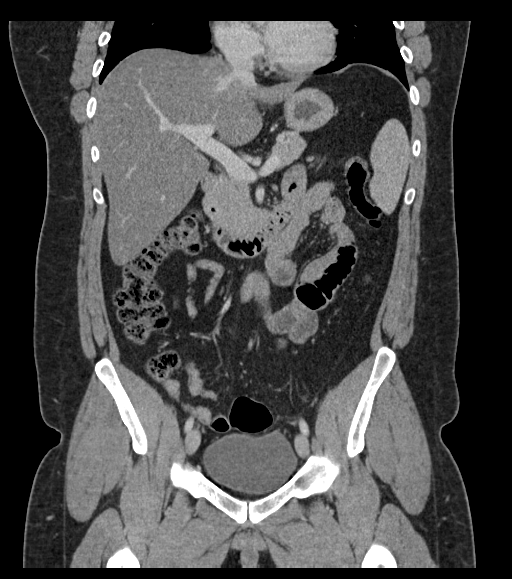
[im 68/123  soft-tissue]
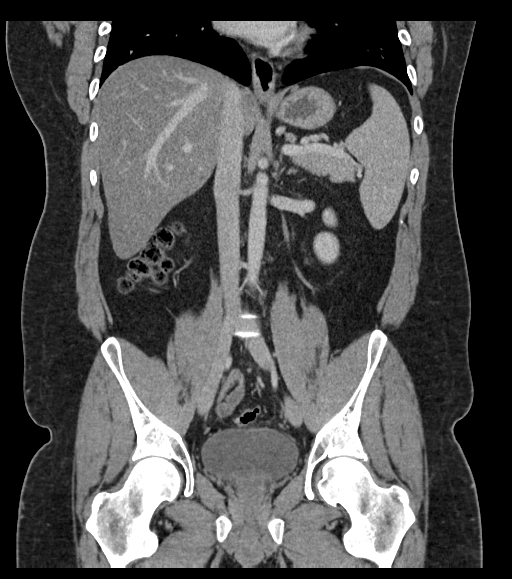

[16 of 46 positions shown; findings below may reference images not displayed]

FINDINGS: Lower chest: Lung bases are clear.

Hepatobiliary: No focal hepatic lesion. Postcholecystectomy. No
biliary dilatation. Low-attenuation within liver could indicate
hepatic steatosis.

Pancreas: Pancreas is normal. No ductal dilatation. No pancreatic
inflammation.

Spleen: Normal spleen

Adrenals/urinary tract: Adrenal glands and kidneys are normal. The
ureters and bladder normal.

Stomach/Bowel: Stomach, small-bowel, terminal ileum, and appendix
are normal. The ascending, transverse and descending colon a normal.

Along the anti mesenteric border of the proximal sigmoid colon there
is an ovoid region of fat enhancement measuring 20 mm by 9 mm (image
69, series 2). Lesion Is also seen on coronal image 33, series 3.
This finding is most suggestive of an inflamed epiploic appendage.
No diverticular disease.

No perforation or abscess

Vascular/Lymphatic: Abdominal aorta is normal caliber. There is no
retroperitoneal or periportal lymphadenopathy. No pelvic
lymphadenopathy.

Reproductive: Prostate normal

Other: No hernia.

Musculoskeletal: No aggressive osseous lesion.
IMPRESSION: 1. Acute epiploic appendagitis of the proximal sigmoid colon. This
is a benign self-limiting process.
2. Normal appendix.
3. Potential hepatic steatosis.

## 2020-12-08 ENCOUNTER — Other Ambulatory Visit: Payer: Self-pay

## 2020-12-08 ENCOUNTER — Emergency Department (HOSPITAL_COMMUNITY): Payer: Self-pay

## 2020-12-08 ENCOUNTER — Emergency Department (HOSPITAL_COMMUNITY)
Admission: EM | Admit: 2020-12-08 | Discharge: 2020-12-08 | Disposition: A | Discharge status: Home / Self Care | Payer: Self-pay | Attending: Emergency Medicine | Admitting: Emergency Medicine

## 2020-12-08 ENCOUNTER — Encounter (HOSPITAL_COMMUNITY): Payer: Self-pay

## 2020-12-08 DIAGNOSIS — M5431 Sciatica, right side: Secondary | ICD-10-CM

## 2020-12-08 DIAGNOSIS — Z87891 Personal history of nicotine dependence: Secondary | ICD-10-CM | POA: Insufficient documentation

## 2020-12-08 DIAGNOSIS — J45909 Unspecified asthma, uncomplicated: Secondary | ICD-10-CM | POA: Insufficient documentation

## 2020-12-08 DIAGNOSIS — M5441 Lumbago with sciatica, right side: Secondary | ICD-10-CM | POA: Insufficient documentation

## 2020-12-08 DIAGNOSIS — M62830 Muscle spasm of back: Secondary | ICD-10-CM | POA: Insufficient documentation

## 2020-12-08 DIAGNOSIS — X509XXA Other and unspecified overexertion or strenuous movements or postures, initial encounter: Secondary | ICD-10-CM | POA: Insufficient documentation

## 2020-12-08 MED ORDER — CYCLOBENZAPRINE HCL 10 MG PO TABS: 10.0000 mg | ORAL_TABLET | Freq: Two times a day (BID) | ORAL | 0 refills | Status: AC | PRN

## 2020-12-08 MED ORDER — METHYLPREDNISOLONE 4 MG PO TBPK: ORAL_TABLET | ORAL | 0 refills | Status: AC

## 2020-12-08 MED ORDER — LIDOCAINE 5 % EX PTCH: 1.0000 | MEDICATED_PATCH | CUTANEOUS | 0 refills | Status: AC

## 2020-12-08 NOTE — Discharge Instructions (Signed)
Your history and exam today are consistent with a muscle pull and spasm causing some of the nurse to have sciatic type pain going in your right leg. The CT scan was overall reassuring with no fracture or dislocation. Please use the steroids to help with the radiating pain and use the muscle medicine and patch to help with the discomfort. Please rest and stay hydrated. Please follow-up with a PCP. If any symptoms change or worsen, please return to the nearest emergency department.

## 2020-12-08 NOTE — ED Notes (Signed)
ED Provider at bedside. 

## 2020-12-08 NOTE — ED Provider Notes (Signed)
[facility]-EMERGENCY DEPT Provider Note   CSN: [id number] Arrival date & time: 12/08/20  1007     History Chief Complaint  Patient presents with  . Back Pain    Richard Roberson is a 30 y.o. male.  The history is provided by the patient and medical records. No language interpreter was used.  Back Pain Location:  Lumbar spine Quality:  Aching, shooting and stabbing Radiates to:  R posterior upper leg and R knee Pain severity:  Severe Pain is:  Unable to specify Onset quality:  Sudden Timing:  Constant Progression:  Unchanged Chronicity:  New Context: twisting   Relieved by:  Nothing Worsened by:  Bending, movement, twisting and touching Ineffective treatments:  None tried Associated symptoms: leg pain   Associated symptoms: no abdominal pain, no bladder incontinence, no bowel incontinence, no chest pain, no dysuria, no fever, no headaches, no numbness, no paresthesias, no perianal numbness, no tingling and no weakness        Past Medical History:  Diagnosis Date  . Asthma    as a child  . Depression   . Suicide (HCC)     There are no problems to display for this patient.   Past Surgical History:  Procedure Laterality Date  . CHOLECYSTECTOMY    . LEG SURGERY Right        Family History  Problem Relation Age of Onset  . Diabetes Mother   . Heart failure Mother   . Hypertension Mother   . Stroke Mother     Social History   Tobacco Use  . Smoking status: Former Smoker    Types: Cigarettes  . Smokeless tobacco: Current User  Vaping Use  . Vaping Use: Every day  . Substances: Nicotine, Flavoring  Substance Use Topics  . Alcohol use: Yes    Comment: socially  . Drug use: Not Currently    Types: Cocaine, Marijuana    Comment: sober x 11 years    Home Medications Prior to Admission medications   Medication Sig Start Date End Date Taking? Authorizing Provider  albuterol (PROVENTIL HFA;VENTOLIN HFA) 108 (90 Base) MCG/ACT  inhaler Inhale 1-2 puffs into the lungs every 6 (six) hours as needed for wheezing or shortness of breath. 09/20/18   Elson Areas, PA-C  azithromycin (ZITHROMAX) 250 MG tablet Take 1 tablet (250 mg total) by mouth daily. Take first 2 tablets together, then 1 every day until finished. 09/20/18   Elson Areas, PA-C  calcium carbonate (TUMS - DOSED IN MG ELEMENTAL CALCIUM) 500 MG chewable tablet Chew 1-2 tablets by mouth as needed for indigestion or heartburn.    [provider]  hydrOXYzine (ATARAX/VISTARIL) 25 MG tablet Take 1 tablet (25 mg total) by mouth 3 (three) times daily as needed for anxiety. 11/14/17   Laveda Abbe, NP  ibuprofen (ADVIL,MOTRIN) 200 MG tablet Take 800 mg by mouth every 6 (six) hours as needed for moderate pain.     [provider]    Allergies    Betadine [povidone iodine]  Review of Systems   Review of Systems  Constitutional: Negative for chills, diaphoresis, fatigue and fever.  HENT: Negative for congestion.   Respiratory: Negative for cough, chest tightness, shortness of breath and wheezing.   Cardiovascular: Negative for chest pain.  Gastrointestinal: Negative for abdominal pain, bowel incontinence, constipation, diarrhea, nausea and vomiting.  Genitourinary: Negative for bladder incontinence, decreased urine volume, dysuria, flank pain and frequency.  Musculoskeletal: Positive for back pain. Negative  for neck pain and neck stiffness.  Skin: Negative for rash and wound.  Neurological: Negative for dizziness, tingling, seizures, weakness, light-headedness, numbness, headaches and paresthesias.  Psychiatric/Behavioral: Negative for agitation and confusion.  All other systems reviewed and are negative.   Physical Exam Updated Vital Signs BP (!) 129/98 (BP Location: Right Arm)   Pulse 87   Temp 98.7 F (37.1 C) (Oral)   Resp 18   Ht 5\' 9"  (1.753 m)   Wt 106.6 kg   SpO2 96%   BMI 34.70 kg/m   Physical Exam Vitals and  nursing note reviewed.  Constitutional:      General: He is not in acute distress.    Appearance: He is well-developed and well-nourished. He is not ill-appearing, toxic-appearing or diaphoretic.  HENT:     Head: Normocephalic and atraumatic.     Mouth/Throat:     Mouth: Mucous membranes are moist.     Pharynx: No oropharyngeal exudate or posterior oropharyngeal erythema.  Eyes:     Extraocular Movements: Extraocular movements intact.     Conjunctiva/sclera: Conjunctivae normal.     Pupils: Pupils are equal, round, and reactive to light.  Cardiovascular:     Rate and Rhythm: Normal rate and regular rhythm.     Heart sounds: No murmur heard.   Pulmonary:     Effort: Pulmonary effort is normal. No respiratory distress.     Breath sounds: Normal breath sounds. No wheezing, rhonchi or rales.  Chest:     Chest wall: No tenderness.  Abdominal:     General: Abdomen is flat.     Palpations: Abdomen is soft.     Tenderness: There is no abdominal tenderness. There is no right CVA tenderness, left CVA tenderness, guarding or rebound.  Musculoskeletal:        General: Tenderness present. No edema.     Cervical back: Neck supple. No tenderness.     Lumbar back: Spasms and tenderness present. Positive right straight leg raise test and positive left straight leg raise test.       Back:     Right lower leg: No edema.     Left lower leg: No edema.     Comments: Tenderness in the right low back and some midline.  Spasms palpated.  Positive straight leg raise but intact sensation strength and pulses.  Skin:    General: Skin is warm and dry.     Capillary Refill: Capillary refill takes less than 2 seconds.     Findings: No erythema.  Neurological:     General: No focal deficit present.     Mental Status: He is alert.     Sensory: No sensory deficit.     Motor: No weakness.     Coordination: Coordination normal.  Psychiatric:        Mood and Affect: Mood and affect and mood normal.      ED Results / Procedures / Treatments   Labs (all labs ordered are listed, but only abnormal results are displayed) Labs Reviewed - No data to display  EKG None  Radiology CT Lumbar Spine Wo Contrast  Result Date: 12/08/2020 CLINICAL DATA:  Low back pain since the patient fell from a pop in his back getting into a car yesterday. EXAM: CT LUMBAR SPINE WITHOUT CONTRAST TECHNIQUE: Multidetector CT imaging of the lumbar spine was performed without intravenous contrast administration. Multiplanar CT image reconstructions were also generated. COMPARISON:  CT abdomen and pelvis 05/30/2017. FINDINGS: Segmentation: Standard. Alignment: Normal. Vertebrae: No  fracture or focal lesion. Paraspinal and other soft tissues: Negative. Disc levels: Intervertebral disc space height is maintained. The central canal and foramina appear open. IMPRESSION: Negative CT lumbar spine. Electronically Signed   By: Drusilla Kanner M.D.   On: 12/08/2020 14:02    Procedures Procedures   Medications Ordered in ED Medications - No data to display  ED Course  I have reviewed the triage vital signs and the nursing notes.  Pertinent labs & imaging results that were available during my care of the patient were reviewed by me and considered in my medical decision making (see chart for details).    MDM Rules/Calculators/A&P                          Richard Roberson is a 30 y.o. male with a past medical history significant for asthma, prior cholecystectomy, depression, and prior right lower leg "reconstruction surgery" who presents with severe back pain and radicular discomfort.  He reports that yesterday, he was bending down to get something out of his car when he felt a loud pop and had sudden onset of severe pain in his right low back.  Reports pain radiates down his right leg is worse when he tries to stand, ambulate, or bend it.  He reports the pain is up to a 9 of 10 severity when moving but is a 7 out of 10  and resting since yesterday.  He reports over-the-counter medications have not help with the discomfort has never had pain this severe before.  His report he has had " Creeks and pops" in his back in the past but has never had to have surgery or pain like this.  He reports he has had x-rays in the past that have been reassuring.  He denies any preceding symptoms fevers, chills congestion, cough, URI symptoms, GI symptoms or urinary symptoms.  He denies any difficulty with urination or bowel movements with incontinence since the injury and pain.  He denies any true numbness or weakness at this time.  Denies any other complaints.  On exam, patient does have significant tenderness in his right low back with palpation spasms.  Midline is slightly tender.  He has positive straight leg raise in both legs with pain on the right low back.  Does not have numbness or weakness on exam.  Denies any GU symptoms.  Lungs clear and chest nontender, abdomen nontender.  Hips nontender initially.  Given the patient's severe pain and this new radicular symptoms with his history of abnormal back, and set of initial x-ray will get CT lumbar spine.  Do not feel he needs MRI initially given his lack of numbness or weakness or incontinence.  Low suspicion for cauda equina currently.  We offered pain medication initially but he would rather wait to see what the images show. If imaging is reassuring, anticipate discharge with muscle relaxant, Lidoderm patch, and likely steroid taper for radicular discomfort  were going to give pain medicine be reports he did drive.    Patient CT did not show any acute abnormalities.  Suspect muscle spasms and injury leading to his symptoms.  Patient be given prescription for muscle relaxant, Lidoderm patch, and Medrol Dosepak.  He agrees with plan of care and follow-up instructions.  Patient discharged in good condition.   Final Clinical Impression(s) / ED Diagnoses Final diagnoses:  None     Rx / DC Orders ED Discharge Orders    None  Clinical Impression: 1. Acute right-sided low back pain with right-sided sciatica   2. Sciatica of right side   3. Muscle spasm of back     Disposition: Discharge  Condition: Good  I have discussed the results, Dx and Tx plan with the pt(& family if present). He/she/they expressed understanding and agree(s) with the plan. Discharge instructions discussed at great length. Strict return precautions discussed and pt &/or family have verbalized understanding of the instructions. No further questions at time of discharge.    Discharge Medication List as of 12/08/2020  5:20 PM    START taking these medications   Details  cyclobenzaprine (FLEXERIL) 10 MG tablet Take 1 tablet (10 mg total) by mouth 2 (two) times daily as needed for muscle spasms., Starting Mon 12/08/2020, Print    lidocaine (LIDODERM) 5 % Place 1 patch onto the skin daily. Remove & Discard patch within 12 hours or as directed by MD, Starting Mon 12/08/2020, Print    methylPREDNISolone (MEDROL DOSEPAK) 4 MG TBPK tablet Please follow the instructions on the Dosepak to take the steroids., Print        Follow Up: [facility] [address] [place] [id number] [phone] Schedule an appointment as soon as possible for a visit    [facility] [address] [id number] [address] [phone]       Aariv Medlock, Canary Brimhristopher J, MD 12/08/20 [phone]

## 2020-12-08 NOTE — ED Triage Notes (Signed)
Patient c/o mid lower back pain since yesterday. Patient states he bent over to get into a car and felt a pop when he bent over. Pain radiates into the right leg.

## 2020-12-08 NOTE — ED Notes (Signed)
An After Visit Summary was printed and given to the patient. Discharge instructions given and no further questions at this time.  

## 2021-03-20 ENCOUNTER — Encounter (HOSPITAL_COMMUNITY): Payer: Self-pay | Admitting: Licensed Clinical Social Worker

## 2021-03-20 ENCOUNTER — Ambulatory Visit (INDEPENDENT_AMBULATORY_CARE_PROVIDER_SITE_OTHER): Payer: No Payment, Other | Admitting: Licensed Clinical Social Worker

## 2021-03-20 ENCOUNTER — Other Ambulatory Visit: Payer: Self-pay

## 2021-03-20 DIAGNOSIS — F4310 Post-traumatic stress disorder, unspecified: Secondary | ICD-10-CM

## 2021-03-20 DIAGNOSIS — F321 Major depressive disorder, single episode, moderate: Secondary | ICD-10-CM

## 2021-03-20 DIAGNOSIS — F411 Generalized anxiety disorder: Secondary | ICD-10-CM | POA: Diagnosis not present
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
## 2021-03-25 ENCOUNTER — Ambulatory Visit (INDEPENDENT_AMBULATORY_CARE_PROVIDER_SITE_OTHER): Payer: No Payment, Other | Admitting: Physician Assistant

## 2021-03-25 ENCOUNTER — Other Ambulatory Visit: Payer: Self-pay

## 2021-03-25 ENCOUNTER — Encounter (HOSPITAL_COMMUNITY): Payer: Self-pay | Admitting: Physician Assistant

## 2021-03-25 VITALS — BP 136/102 | HR 84 | Ht 69.0 in | Wt 260.0 lb

## 2021-03-25 DIAGNOSIS — F411 Generalized anxiety disorder: Secondary | ICD-10-CM | POA: Diagnosis not present

## 2021-03-25 DIAGNOSIS — F603 Borderline personality disorder: Secondary | ICD-10-CM

## 2021-03-25 DIAGNOSIS — F4310 Post-traumatic stress disorder, unspecified: Secondary | ICD-10-CM | POA: Diagnosis not present

## 2021-03-25 DIAGNOSIS — F321 Major depressive disorder, single episode, moderate: Secondary | ICD-10-CM

## 2021-03-25 MED ORDER — QUETIAPINE FUMARATE 50 MG PO TABS: 50.0000 mg | ORAL_TABLET | Freq: Every day | ORAL | 1 refills | Status: DC
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
## 2021-03-26 ENCOUNTER — Encounter (HOSPITAL_COMMUNITY): Payer: Self-pay | Admitting: Physician Assistant

## 2021-04-28 ENCOUNTER — Other Ambulatory Visit: Payer: Self-pay

## 2021-04-28 ENCOUNTER — Ambulatory Visit (INDEPENDENT_AMBULATORY_CARE_PROVIDER_SITE_OTHER): Payer: No Payment, Other | Admitting: Licensed Clinical Social Worker

## 2021-04-28 DIAGNOSIS — F321 Major depressive disorder, single episode, moderate: Secondary | ICD-10-CM

## 2021-04-28 DIAGNOSIS — F411 Generalized anxiety disorder: Secondary | ICD-10-CM

## 2021-04-28 NOTE — Progress Notes (Signed)
THERAPIST PROGRESS NOTE  Session Time: 61   Virtual Visit via Video Note  I connected with Richard Roberson on 04/28/21 at 10:00 AM EDT by a video enabled telemedicine application and verified that I am speaking with the correct person using two identifiers.  Location: Patient: Richard Roberson  Provider: [facility]    I discussed the limitations of evaluation and management by telemedicine and the availability of in person appointments. The patient expressed understanding and agreed to proceed.      I discussed the assessment and treatment plan with the patient. The patient was provided an opportunity to ask questions and all were answered. The patient agreed with the plan and demonstrated an understanding of the instructions.   The patient was advised to call back or seek an in-person evaluation if the symptoms worsen or if the condition fails to improve as anticipated.  I provided 45 minutes of non-face-to-face time during this encounter.   [name], Richard Roberson   Participation Level: Active  Behavioral Response: CasualAlertDepressed  Type of Therapy: Individual Therapy  Treatment Goals addressed: Diagnosis: depression and anxiety   Interventions: CBT and Supportive  Summary: Richard Roberson is a 30 y.o. male who presents with euthymic mood\affect.  Patient was alert and oriented x5.  Richard Roberson was dressed casually and engaged well in therapy session.  He was cooperative and pleasant in today's session.   Patient's primary stressors are work, grief/loss, and financial.  Patient reports that his grandmothers birthday is next week along with his birthday.  This is causing patient's stress and tension.  He reports that this is because this is the first year that he will not be able to take his grandmother to her favorite restaurant or get a phone call from her.  Other stressors reported by patient were financial and work.  Patient states that he is working  about 80 hours/week for [employer]).  This nets  an income about $5200 per month.  This is his only source of income as his fiance is not working as the school year is out of session.  Richard Roberson's fianc is a Runner, broadcasting/film/video, he encourages his fiance not to work during the summers as he feels that this is her time for self-care.  Jakayden does state there is a positive in the situation and that is she will start work back up in August.   Patient states due to the stress of work and financials he has started to drink 3-5 beers per night the past 2 weeks.  He has not drink anything in the past 3 days and affiliates the drinking with his stress.  Goal for patient is to get to August 1 with no drinking of alcohol.  L CSW did advise patient that if he felt any withdrawal symptoms he should go to his nearest emergency room for medical treatment.  Suicidal/Homicidal: NAwithout intent/plan  Therapist Response:    Intervention/Plan: Interventions utilized in today's session were supportive and existential therapy.  Patient dictated goals and objectives in today's session.  As evidenced by stating goal of not drinking until August 1.  Supportive therapy was also utilized in today's session as evidenced by using encouragement, praise, and advice given.  Plan for patient moving forward will be to write a letter to his deceased grandmother and he will read that letter to his grandmother's ashes on her birthday next week.  Patient then will report back to Richard Roberson in 4 weeks.  Plan: Return again in 4  weeks.     [name], Richard Roberson 04/28/2021

## 2021-05-06 ENCOUNTER — Encounter (HOSPITAL_COMMUNITY): Payer: Self-pay | Admitting: Physician Assistant

## 2021-05-06 ENCOUNTER — Other Ambulatory Visit: Payer: Self-pay

## 2021-05-06 ENCOUNTER — Ambulatory Visit (INDEPENDENT_AMBULATORY_CARE_PROVIDER_SITE_OTHER): Payer: No Payment, Other | Admitting: Physician Assistant

## 2021-05-06 VITALS — BP 163/91 | HR 90 | Ht 69.0 in | Wt 260.0 lb

## 2021-05-06 DIAGNOSIS — G4700 Insomnia, unspecified: Secondary | ICD-10-CM | POA: Insufficient documentation

## 2021-05-06 DIAGNOSIS — F411 Generalized anxiety disorder: Secondary | ICD-10-CM | POA: Diagnosis not present

## 2021-05-06 DIAGNOSIS — F321 Major depressive disorder, single episode, moderate: Secondary | ICD-10-CM | POA: Diagnosis not present

## 2021-05-06 DIAGNOSIS — F603 Borderline personality disorder: Secondary | ICD-10-CM | POA: Diagnosis not present

## 2021-05-06 DIAGNOSIS — F4310 Post-traumatic stress disorder, unspecified: Secondary | ICD-10-CM

## 2021-05-06 MED ORDER — ARIPIPRAZOLE 5 MG PO TABS: 5.0000 mg | ORAL_TABLET | Freq: Every day | ORAL | 1 refills | Status: DC

## 2021-05-06 MED ORDER — SERTRALINE HCL 50 MG PO TABS: 50.0000 mg | ORAL_TABLET | Freq: Every day | ORAL | 1 refills | Status: DC

## 2021-05-06 MED ORDER — TRAZODONE HCL 50 MG PO TABS: 50.0000 mg | ORAL_TABLET | Freq: Every day | ORAL | 1 refills | Status: DC

## 2021-05-06 NOTE — Progress Notes (Signed)
BH MD/PA/NP OP Progress Note  05/06/2021 12:35 PM Richard Roberson  MRN:  [id number]  Chief Complaint:  Chief Complaint   Medication Management    HPI:   Richard BullaChristopher H. Roberson is a 30 year old male with a past psychiatric history significant for borderline personality disorder, PTSD, generalized anxiety disorder, major depressive disorder, and insomnia who [facility] for follow-up and medication management.  During the last encounter, patient was placed on the following medication: Quetiapine 50 mg at bedtime.  Patient expresses that he has not been able to tell the difference in the management of his symptoms.  He expresses that his quetiapine makes him entirely too sleepy.  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  Patient reports that it has been a notably hard week for him.  He reports that his mother's (grandmother who he refers as his mother) birthday is tomorrow and this will be the first year being without her.  Patient endorses the following depressive symptoms: depressed mood, fatigue, feelings of guilt/worthlessness, decreased concentration, irritability, and decreased appetite.  Patient also endorses anxiety he rates an 8 out of 10 with no discernible triggers.  A PHQ-9 screen was performed with the patient scoring a 22.  A GAD-7 screen was also performed with the patient scoring a 20.  Patient is alert and oriented x4, calm, cooperative, and fully engaged in conversation during the encounter.  Patient states that 50% of the time he is very, very angry or irate.  He describes his mood as, "I don't give a shit mood."  Patient denies suicidal ideations but states that he had a fleeting thought on Monday.  Patient denies homicidal ideations.  He endorses experiencing an auditory hallucination today characterized as someone calling out his name.  Patient  denies visual hallucinations.  Patient endorses fair sleep and receives on average 8 to 9 hours of sleep, however, he still feels sleepy upon waking.  He states that he often feels like he did not go to sleep at all when taking quetiapine.  Patient endorses poor appetite and states that his appetite always fluctuates between eating 3-4 meals or eating nothing at all.  Patient endorses alcohol consumption and states that he drank 1-2 beers last Thursday.  Patient denies tobacco use but does engage in vaping.  Patient states that he vapes every day every 3 to 6 minutes due to stress.  Patient denies illicit drug use.   Visit Diagnosis:    ICD-10-CM   1. Borderline personality disorder (HCC)  F60.3     2. PTSD (post-traumatic stress disorder)  F43.10 sertraline (ZOLOFT) 50 MG tablet    3. GAD (generalized anxiety disorder)  F41.1 sertraline (ZOLOFT) 50 MG tablet    4. Current moderate episode of major depressive disorder, unspecified whether recurrent (HCC)  F32.1 sertraline (ZOLOFT) 50 MG tablet    ARIPiprazole (ABILIFY) 5 MG tablet    5. Insomnia, unspecified type  G47.00 traZODone (DESYREL) 50 MG tablet      Past Psychiatric History:  Borderline personality disorder Generalized anxiety disorder PTSD Major depressive disorder Insomnia  Past Medical History:  Past Medical History:  Diagnosis Date   Asthma    as a child   Depression    Suicide (HCC)     Past Surgical History:  Procedure Laterality Date   CHOLECYSTECTOMY     LEG SURGERY Right     Family Psychiatric  History:  Grandmother (patient refers to his grandmother as his mother since she is the one that raised him a majority of his life). Patient reports that his mother was diagnosed with bipolar disorder and schizophrenia. Mother (Biological/birth mother) - Bipolar disorder Aunt -  Bipolar/schizophrenia Father's side -  depression and anxiety   Patient reports that alcoholism also runs in his family  Family History:   Family History  Problem Relation Age of Onset   Diabetes Mother    Heart failure Mother    Hypertension Mother    Stroke Mother     Social History:  Social History   Socioeconomic History   Marital status: Single    Spouse name: Not on file   Number of children: Not on file   Years of education: Not on file   Highest education level: Not on file  Occupational History   Not on file  Tobacco Use   Smoking status: Former    Types: E-cigarettes   Smokeless tobacco: Former    Types: Associate Professor Use: Every day   Substances: Nicotine, Flavoring  Substance and Sexual Activity   Alcohol use: Yes    Comment: socially   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   Sexual activity: Yes  Other Topics Concern   Not on file  Social History Narrative   Not on file   Social Determinants of Health   Financial Resource Strain: High Risk   Difficulty of Paying Living Expenses: Hard  Food Insecurity: Food Insecurity Present   Worried About Running Out of Food in the Last Year: Sometimes true   Ran Out of Food in the Last Year: Sometimes true  Transportation Needs: No Transportation Needs   Lack of Transportation (Medical): No   Lack of Transportation (Non-Medical): No  Physical Activity: Insufficiently Active   Days of Exercise per Week: 1 day   Minutes of Exercise per Session: 60 min  Stress: Stress Concern Present   Feeling of Stress : Very much  Social Connections: Socially Isolated   Frequency of Communication with Friends and Family: Never   Frequency of Social Gatherings with Friends and Family: Once a week   Attends Religious Services: Never   Database administrator or Organizations: No   Attends Banker Meetings: Never   Marital Status: Living with partner    Allergies:  Allergies  Allergen Reactions   Betadine [Povidone Iodine] Other (See Comments)    Had surgery and incision got infected whiling healing with cast on      Metabolic Disorder Labs: No results found for: HGBA1C, MPG No results found for: PROLACTIN No results found for: CHOL, TRIG, HDL, CHOLHDL, VLDL, LDLCALC No results found for: TSH  Therapeutic Level Labs: No results found for: LITHIUM No results found for: VALPROATE No components found for:  CBMZ  Current Medications: Current Outpatient Medications  Medication Sig Dispense Refill   ARIPiprazole (ABILIFY) 5 MG tablet Take 1 tablet (5 mg total) by mouth daily. 30 tablet 1   sertraline (ZOLOFT) 50 MG tablet Take 1 tablet (50 mg total) by mouth daily. 30 tablet 1   traZODone (DESYREL) 50 MG tablet Take 1 tablet (50 mg total) by mouth at bedtime. 30 tablet 1   albuterol (PROVENTIL HFA;VENTOLIN HFA) 108 (90 Base) MCG/ACT inhaler Inhale 1-2 puffs into the lungs every 6 (six) hours as needed for wheezing or shortness of breath. 1 Inhaler 0   azithromycin (  ZITHROMAX) 250 MG tablet Take 1 tablet (250 mg total) by mouth daily. Take first 2 tablets together, then 1 every day until finished. 6 tablet 0   calcium carbonate (TUMS - DOSED IN MG ELEMENTAL CALCIUM) 500 MG chewable tablet Chew 1-2 tablets by mouth as needed for indigestion or heartburn.     cyclobenzaprine (FLEXERIL) 10 MG tablet Take 1 tablet (10 mg total) by mouth 2 (two) times daily as needed for muscle spasms. 20 tablet 0   hydrOXYzine (ATARAX/VISTARIL) 25 MG tablet Take 1 tablet (25 mg total) by mouth 3 (three) times daily as needed for anxiety. 12 tablet 0   ibuprofen (ADVIL,MOTRIN) 200 MG tablet Take 800 mg by mouth every 6 (six) hours as needed for moderate pain.      lidocaine (LIDODERM) 5 % Place 1 patch onto the skin daily. Remove & Discard patch within 12 hours or as directed by MD 15 patch 0   methylPREDNISolone (MEDROL DOSEPAK) 4 MG TBPK tablet Please follow the instructions on the Dosepak to take the steroids. 21 each 0   No current facility-administered medications for this visit.     Musculoskeletal: Strength &  Muscle Tone: within normal limits Gait & Station: normal Patient leans: N/A  Psychiatric Specialty Exam: Review of Systems  Psychiatric/Behavioral:  Positive for decreased concentration and sleep disturbance. Negative for dysphoric mood, hallucinations, self-injury and suicidal ideas. The patient is nervous/anxious. The patient is not hyperactive.    Blood pressure (!) 163/91, pulse 90, height 5\' 9"  (1.753 m), weight 260 lb (117.9 kg), SpO2 97 %.Body mass index is 38.4 kg/m.  General Appearance: Fairly Groomed  Eye Contact:  Good  Speech:  Clear and Coherent and Normal Rate  Volume:  Normal  Mood:  Anxious and Depressed  Affect:  Congruent and Depressed  Thought Process:  Coherent, Goal Directed, and Descriptions of Associations: Intact  Orientation:  Full (Time, Place, and Person)  Thought Content: WDL and Hallucinations: Auditory   Suicidal Thoughts:  No  Homicidal Thoughts:  No  Memory:  Immediate;   Good Recent;   Good Remote;   Fair  Judgement:  Good  Insight:  Good  Psychomotor Activity:  Restlessness  Concentration:  Concentration: Good and Attention Span: Good  Recall:  Fair  Fund of Knowledge: Good  Language: Good  Akathisia:  NA  Handed:  Right  AIMS (if indicated): not done  Assets:  Communication Skills Desire for Improvement Housing Social Support Vocational/Educational  ADL's:  Intact  Cognition: WNL  Sleep:  Poor   Screenings: AUDIT    from 03/20/2021 in [facility]  Alcohol Use Disorder Identification Test Final Score (AUDIT) 8      GAD-7    Flowsheet Row Office Visit from 05/06/2021 in [facility] Office Visit from 03/25/2021 in [facility]  Total GAD-7 Score 20 20      PHQ2-9    Flowsheet Row Office Visit from 05/06/2021 in [facility] Office Visit from 03/25/2021 in [facility]  Counselor from 03/20/2021 in [facility]  PHQ-2 Total Score 5 5 5   PHQ-9 Total Score 22 23 22       Flowsheet Row Office Visit from 05/06/2021 in [facility] Office Visit from 03/25/2021 in [facility] Counselor from 03/20/2021 in [facility]  C-SSRS RISK CATEGORY Low Risk Low Risk Low Risk  Assessment and Plan:   Richard Roberson is a 30 year old male with a past psychiatric history significant for borderline personality disorder, PTSD, generalized anxiety disorder, major depressive disorder, and insomnia who presents to [facility] for follow-up and medication management.  Patient expresses no change in his symptoms since taking quetiapine 50 mg at bedtime.  Patient continues to endorse depressive episodes, anxiety, and sleep disturbances.  He also expresses experiencing an auditory hallucination earlier today.  Patient to be taken off Seroquel.  Patient was recommended Abilify 5 mg daily for the management of his depressive symptoms and occasional auditory hallucinations.  Patient was also recommended sertraline 50 mg daily for the management of his depressive symptoms.  Patient to take half tablet (25 mg total) for 6 days followed by full tablet (50 mg total) daily.  Lastly, patient was placed on trazodone 50 mg at bedtime for the management of his sleep disturbances.  Patient was agreeable to recommendation.  Patient's medications to be e-prescribed to pharmacy of choice.  1. PTSD (post-traumatic stress disorder)  - sertraline (ZOLOFT) 50 MG tablet; Take 1 tablet (50 mg total) by mouth daily.  Dispense: 30 tablet; Refill: 1  2. GAD (generalized anxiety disorder)  - sertraline (ZOLOFT) 50 MG tablet; Take 1 tablet (50 mg total) by mouth daily.  Dispense: 30 tablet; Refill: 1  3. Current moderate episode of major depressive  disorder, unspecified whether recurrent (HCC)  - sertraline (ZOLOFT) 50 MG tablet; Take 1 tablet (50 mg total) by mouth daily.  Dispense: 30 tablet; Refill: 1 - ARIPiprazole (ABILIFY) 5 MG tablet; Take 1 tablet (5 mg total) by mouth daily.  Dispense: 30 tablet; Refill: 1  4. Borderline personality disorder (HCC)   5. Insomnia, unspecified type  - traZODone (DESYREL) 50 MG tablet; Take 1 tablet (50 mg total) by mouth at bedtime.  Dispense: 30 tablet; Refill: 1  Patient to follow up in 6 weeks Provider spent a total of 20 minutes with the patient/reviewing patient's chart  Meta Hatchet, PA 05/06/2021, 12:35 PM

## 2021-05-28 ENCOUNTER — Ambulatory Visit (HOSPITAL_COMMUNITY): Payer: Self-pay | Admitting: Licensed Clinical Social Worker

## 2021-06-22 ENCOUNTER — Telehealth (INDEPENDENT_AMBULATORY_CARE_PROVIDER_SITE_OTHER): Payer: No Payment, Other | Admitting: Physician Assistant

## 2021-06-22 ENCOUNTER — Encounter (HOSPITAL_COMMUNITY): Payer: Self-pay | Admitting: Physician Assistant

## 2021-06-22 DIAGNOSIS — G4700 Insomnia, unspecified: Secondary | ICD-10-CM | POA: Diagnosis not present

## 2021-06-22 DIAGNOSIS — F321 Major depressive disorder, single episode, moderate: Secondary | ICD-10-CM | POA: Diagnosis not present

## 2021-06-22 DIAGNOSIS — F603 Borderline personality disorder: Secondary | ICD-10-CM

## 2021-06-22 DIAGNOSIS — F4310 Post-traumatic stress disorder, unspecified: Secondary | ICD-10-CM

## 2021-06-22 DIAGNOSIS — F411 Generalized anxiety disorder: Secondary | ICD-10-CM

## 2021-06-22 MED ORDER — ARIPIPRAZOLE 5 MG PO TABS: 5.0000 mg | ORAL_TABLET | Freq: Every day | ORAL | 1 refills | Status: DC

## 2021-06-22 MED ORDER — TRAZODONE HCL 50 MG PO TABS: 50.0000 mg | ORAL_TABLET | Freq: Every day | ORAL | 1 refills | Status: DC

## 2021-06-22 MED ORDER — SERTRALINE HCL 50 MG PO TABS: 50.0000 mg | ORAL_TABLET | Freq: Every day | ORAL | 1 refills | Status: DC

## 2021-06-22 NOTE — Progress Notes (Signed)
BH MD/PA/NP OP Progress Note  Virtual Visit via Telephone Note  I connected with Richard Roberson on 06/22/21 at 11:00 AM EDT by telephone and verified that I am speaking with the correct person using two identifiers.  Location: Patient: Home Provider: Clinic   I discussed the limitations, risks, security and privacy concerns of performing an evaluation and management service by telephone and the availability of in person appointments. I also discussed with the patient that there may be a patient responsible charge related to this service. The patient expressed understanding and agreed to proceed.  Follow Up Instructions:   I discussed the assessment and treatment plan with the patient. The patient was provided an opportunity to ask questions and all were answered. The patient agreed with the plan and demonstrated an understanding of the instructions.   The patient was advised to call back or seek an in-person evaluation if the symptoms worsen or if the condition fails to improve as anticipated.  I provided 21 minutes of non-face-to-face time during this encounter.  Meta HatchetUchenna E Gurbani Figge, PA   06/22/2021 11:29 AM Richard Roberson  MRN:  [id number]  Chief Complaint: Follow and medication management  HPI:   Richard Roberson is a 30 year old male with a past psychiatric history significant for borderline personality disorder, PTSD, generalized anxiety disorder, major depressive disorder, and insomnia who [facility] for follow-up and medication management.  Patient is currently being managed on the following medications:  Abilify 5 mg daily Sertraline 50 mg daily Trazodone 50 mg at bedtime  Patient reports that it took him a while to get his medications due to finances.  Patient states that he has been taking his medications for roughly 2 weeks now.  He reports sleeping better and that his appetite has improved.  Patient reports  that his binge eating has also discontinued.  He endorses a decrease in his anxiety but still rates his anxiety a 6 or 7 out of 10, depending on the situation.  Patient still endorses depressive symptoms characterized by low self-esteem, decreased energy, and negative thoughts.    Patient's main stressor is the recent interpersonal conflict between him and his fiance.  Patient states that the argument was a result of the patient's behavior/mental health.  Patient states that he was presented with an ultimatum after the end of the argument.  Since the argument, patient states that there has been improvements between him and his fiance.  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  Patient has aspirations to. A PHQ-9 screen was performed with the patient scoring a 19.  A GAD-7 screen was performed with the patient scoring an 18.  Patient is alert and oriented x4, pleasant, calm, cooperative, and fully engaged in conversation during the encounter.  Patient reports that he is calm and mellow and has no major issues regarding his mental health.  Patient denies active suicidal ideations but states that he had a recent thought which he attributes to the upcoming anniversary of his mother's passing.  Patient denies homicidal ideations.  Patient denies active auditory or visual hallucinations but states that he occasionally hears voices around him when he is alone.  Patient endorses fair sleep and receives on average 7 to 8 hours of sleep each night.  Patient endorses improved appetite and eats on average 2 meals and 3 snacks a day.  Patient endorses occasional alcohol consumption.  Patient denies tobacco use but states that he engages in vaping every day.  Patient denies illicit drug use.  Visit Diagnosis:    ICD-10-CM   1. Current moderate episode of major depressive disorder, unspecified whether recurrent (HCC)  F32.1 ARIPiprazole (ABILIFY) 5 MG  tablet    sertraline (ZOLOFT) 50 MG tablet    2. Insomnia, unspecified type  G47.00 traZODone (DESYREL) 50 MG tablet    3. PTSD (post-traumatic stress disorder)  F43.10 sertraline (ZOLOFT) 50 MG tablet    4. GAD (generalized anxiety disorder)  F41.1 sertraline (ZOLOFT) 50 MG tablet      Past Psychiatric History:  Borderline personality disorder Generalized anxiety disorder PTSD Major depressive disorder Insomnia  Past Medical History:  Past Medical History:  Diagnosis Date   Asthma    as a child   Depression    Suicide (HCC)     Past Surgical History:  Procedure Laterality Date   CHOLECYSTECTOMY     LEG SURGERY Right     Family Psychiatric History:  Grandmother (patient refers to his grandmother as his mother since she is the one that raised him a majority of his life). Patient reports that his mother was diagnosed with bipolar disorder and schizophrenia. Mother (Biological/birth mother) - Bipolar disorder Aunt -  Bipolar/schizophrenia Father's side -  depression and anxiety   Patient reports that alcoholism also runs in his family  Family History:  Family History  Problem Relation Age of Onset   Diabetes Mother    Heart failure Mother    Hypertension Mother    Stroke Mother     Social History:  Social History   Socioeconomic History   Marital status: Single    Spouse name: Not on file   Number of children: Not on file   Years of education: Not on file   Highest education level: Not on file  Occupational History   Not on file  Tobacco Use   Smoking status: Former    Types: E-cigarettes   Smokeless tobacco: Former    Types: Associate Professor Use: Every day   Substances: Nicotine, Flavoring  Substance and Sexual Activity   Alcohol use: Yes    Comment: socially   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   Sexual activity: Yes  Other Topics Concern   Not on file  Social History Narrative   Not on file   Social Determinants  of Health   Financial Resource Strain: High Risk   Difficulty of Paying Living Expenses: Hard  Food Insecurity: Food Insecurity Present   Worried About Running Out of Food in the Last Year: Sometimes true   Ran Out of Food in the Last Year: Sometimes true  Transportation Needs: No Transportation Needs   Lack of Transportation (Medical): No   Lack of Transportation (Non-Medical): No  Physical Activity: Insufficiently Active   Days of Exercise per Week: 1 day   Minutes of Exercise per Session: 60 min  Stress: Stress Concern Present   Feeling of Stress : Very much  Social Connections: Socially Isolated   Frequency of Communication with Friends and Family: Never   Frequency of Social Gatherings with Friends and Family: Once a week   Attends Religious Services: Never   Database administrator or Organizations: No   Attends Banker Meetings: Never   Marital Status: Living with partner    Allergies:  Allergies  Allergen Reactions   Betadine [Povidone Iodine]  Other (See Comments)    Had surgery and incision got infected whiling healing with cast on     Metabolic Disorder Labs: No results found for: HGBA1C, MPG No results found for: PROLACTIN No results found for: CHOL, Richard, HDL, CHOLHDL, VLDL, LDLCALC No results found for: TSH  Therapeutic Level Labs: No results found for: LITHIUM No results found for: VALPROATE No components found for:  CBMZ  Current Medications: Current Outpatient Medications  Medication Sig Dispense Refill   albuterol (PROVENTIL HFA;VENTOLIN HFA) 108 (90 Base) MCG/ACT inhaler Inhale 1-2 puffs into the lungs every 6 (six) hours as needed for wheezing or shortness of breath. 1 Inhaler 0   ARIPiprazole (ABILIFY) 5 MG tablet Take 1 tablet (5 mg total) by mouth daily. 30 tablet 1   azithromycin (ZITHROMAX) 250 MG tablet Take 1 tablet (250 mg total) by mouth daily. Take first 2 tablets together, then 1 every day until finished. 6 tablet 0   calcium  carbonate (TUMS - DOSED IN MG ELEMENTAL CALCIUM) 500 MG chewable tablet Chew 1-2 tablets by mouth as needed for indigestion or heartburn.     cyclobenzaprine (FLEXERIL) 10 MG tablet Take 1 tablet (10 mg total) by mouth 2 (two) times daily as needed for muscle spasms. 20 tablet 0   hydrOXYzine (ATARAX/VISTARIL) 25 MG tablet Take 1 tablet (25 mg total) by mouth 3 (three) times daily as needed for anxiety. 12 tablet 0   ibuprofen (ADVIL,MOTRIN) 200 MG tablet Take 800 mg by mouth every 6 (six) hours as needed for moderate pain.      lidocaine (LIDODERM) 5 % Place 1 patch onto the skin daily. Remove & Discard patch within 12 hours or as directed by MD 15 patch 0   methylPREDNISolone (MEDROL DOSEPAK) 4 MG TBPK tablet Please follow the instructions on the Dosepak to take the steroids. 21 each 0   sertraline (ZOLOFT) 50 MG tablet Take 1 tablet (50 mg total) by mouth daily. 30 tablet 1   traZODone (DESYREL) 50 MG tablet Take 1 tablet (50 mg total) by mouth at bedtime. 30 tablet 1   No current facility-administered medications for this visit.     Musculoskeletal: Strength & Muscle Tone: Unable to assess due to telemedicine visit Gait & Station: Unable to assess due to telemedicine visit Patient leans: Unable to assess due to telemedicine visit  Psychiatric Specialty Exam: Review of Systems  Psychiatric/Behavioral:  Positive for hallucinations and sleep disturbance. Negative for decreased concentration, dysphoric mood, self-injury and suicidal ideas. The patient is nervous/anxious. The patient is not hyperactive.    There were no vitals taken for this visit.There is no height or weight on file to calculate BMI.  General Appearance: Unable to assess due to telemedicine visit  Eye Contact:  Unable to assess due to telemedicine visit  Speech:  Clear and Coherent and Normal Rate  Volume:  Normal  Mood:  Anxious and Depressed  Affect:  Congruent and Depressed  Thought Process:  Coherent and Descriptions  of Associations: Intact  Orientation:  Full (Time, Place, and Person)  Thought Content: WDL and Hallucinations: Auditory   Suicidal Thoughts:  No  Homicidal Thoughts:  No  Memory:  Immediate;   Good Recent;   Good Remote;   Fair  Judgement:  Good  Insight:  Good  Psychomotor Activity:  Restlessness  Concentration:  Concentration: Good and Attention Span: Good  Recall:  Fair  Fund of Knowledge: Good  Language: Good  Akathisia:  NA  Handed:  Right  AIMS (  if indicated): not done  Assets:  Communication Skills Desire for Improvement Housing Social Support Vocational/Educational  ADL's:  Intact  Cognition: WNL  Sleep:  Fair   Screenings: AUDIT    Advertising copywriter from 03/20/2021 in [facility]  Alcohol Use Disorder Identification Test Final Score (AUDIT) 8      GAD-7    Flowsheet Row Video Visit from 06/22/2021 in [facility] Office Visit from 05/06/2021 in [facility] Office Visit from 03/25/2021 in [facility]  Total GAD-7 Score 18 20 20       PHQ2-9    Flowsheet Row Video Visit from 06/22/2021 in [facility] Office Visit from 05/06/2021 in [facility] Office Visit from 03/25/2021 in [facility] Counselor from 03/20/2021 in [facility]  PHQ-2 Total Score 4 5 5 5   PHQ-9 Total Score 19 22 23 22       Flowsheet Row Video Visit from 06/22/2021 in [facility] Office Visit from 05/06/2021 in [facility] Office Visit from 03/25/2021 in [facility]  C-SSRS RISK CATEGORY Low Risk Low Risk Low Risk        Assessment and Plan:   Richard Roberson is a 30 year old male with a past psychiatric history significant for borderline personality disorder, PTSD,  generalized anxiety disorder, major depressive disorder, and insomnia who presents to [facility] for follow-up and medication management.  Patient reports that he recently started taking his medications due to finances and has only been on his medications for roughly 2 weeks.  Patient still endorses episodes of depression as well as anxiety but does endorse some improvements in his mood and sleep.  Patient is requesting refills on all his medications following the conclusion of the encounter.  Patient's medications to be e-prescribed to pharmacy of choice.  1. Current moderate episode of major depressive disorder, unspecified whether recurrent (HCC)  - ARIPiprazole (ABILIFY) 5 MG tablet; Take 1 tablet (5 mg total) by mouth daily.  Dispense: 30 tablet; Refill: 1 - sertraline (ZOLOFT) 50 MG tablet; Take 1 tablet (50 mg total) by mouth daily.  Dispense: 30 tablet; Refill: 1  2. Insomnia, unspecified type  - traZODone (DESYREL) 50 MG tablet; Take 1 tablet (50 mg total) by mouth at bedtime.  Dispense: 30 tablet; Refill: 1  3. PTSD (post-traumatic stress disorder)  - sertraline (ZOLOFT) 50 MG tablet; Take 1 tablet (50 mg total) by mouth daily.  Dispense: 30 tablet; Refill: 1  4. GAD (generalized anxiety disorder)  - sertraline (ZOLOFT) 50 MG tablet; Take 1 tablet (50 mg total) by mouth daily.  Dispense: 30 tablet; Refill: 1  5. Borderline personality disorder [facility])  Patient to follow up in 6 weeks Provider spent a total of 21 minutes with the patient/reviewing patient's chart  34, PA 06/22/2021, 11:29 AM

## 2021-06-25 ENCOUNTER — Other Ambulatory Visit: Payer: Self-pay

## 2021-06-25 ENCOUNTER — Ambulatory Visit (INDEPENDENT_AMBULATORY_CARE_PROVIDER_SITE_OTHER): Payer: No Payment, Other | Admitting: Licensed Clinical Social Worker

## 2021-06-25 DIAGNOSIS — F331 Major depressive disorder, recurrent, moderate: Secondary | ICD-10-CM

## 2021-06-25 DIAGNOSIS — F4310 Post-traumatic stress disorder, unspecified: Secondary | ICD-10-CM

## 2021-06-25 DIAGNOSIS — F603 Borderline personality disorder: Secondary | ICD-10-CM

## 2021-06-25 DIAGNOSIS — F411 Generalized anxiety disorder: Secondary | ICD-10-CM
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
## 2021-07-17 ENCOUNTER — Ambulatory Visit (HOSPITAL_COMMUNITY): Payer: No Payment, Other | Admitting: Licensed Clinical Social Worker

## 2021-07-17 ENCOUNTER — Other Ambulatory Visit: Payer: Self-pay

## 2021-07-17 DIAGNOSIS — F321 Major depressive disorder, single episode, moderate: Secondary | ICD-10-CM

## 2021-07-17 DIAGNOSIS — F411 Generalized anxiety disorder: Secondary | ICD-10-CM
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
## 2021-08-07 ENCOUNTER — Telehealth (HOSPITAL_COMMUNITY): Payer: No Payment, Other | Admitting: Physician Assistant

## 2021-08-21 ENCOUNTER — Ambulatory Visit (HOSPITAL_COMMUNITY): Payer: No Payment, Other | Admitting: Licensed Clinical Social Worker

## 2021-09-11 ENCOUNTER — Telehealth (INDEPENDENT_AMBULATORY_CARE_PROVIDER_SITE_OTHER): Payer: No Payment, Other | Admitting: Physician Assistant

## 2021-09-11 ENCOUNTER — Ambulatory Visit (INDEPENDENT_AMBULATORY_CARE_PROVIDER_SITE_OTHER): Payer: No Payment, Other | Admitting: Licensed Clinical Social Worker

## 2021-09-11 DIAGNOSIS — F321 Major depressive disorder, single episode, moderate: Secondary | ICD-10-CM

## 2021-09-11 DIAGNOSIS — G4700 Insomnia, unspecified: Secondary | ICD-10-CM | POA: Diagnosis not present

## 2021-09-11 DIAGNOSIS — F4310 Post-traumatic stress disorder, unspecified: Secondary | ICD-10-CM

## 2021-09-11 DIAGNOSIS — F411 Generalized anxiety disorder: Secondary | ICD-10-CM

## 2021-09-11 DIAGNOSIS — F603 Borderline personality disorder: Secondary | ICD-10-CM

## 2021-09-11 MED ORDER — ARIPIPRAZOLE 5 MG PO TABS: 5.0000 mg | ORAL_TABLET | Freq: Every day | ORAL | 1 refills | Status: DC

## 2021-09-11 MED ORDER — TRAZODONE HCL 100 MG PO TABS: 100.0000 mg | ORAL_TABLET | Freq: Every day | ORAL | 1 refills | Status: DC

## 2021-09-11 MED ORDER — SERTRALINE HCL 50 MG PO TABS: 50.0000 mg | ORAL_TABLET | Freq: Every day | ORAL | 1 refills | Status: DC

## 2021-09-11 NOTE — Progress Notes (Addendum)
BH MD/PA/NP OP Progress Note  Virtual Visit via Telephone Note  I connected with Richard Roberson on 09/11/21 at 10:00 AM EST by telephone and verified that I am speaking with the correct person using two identifiers.  Location: Patient: Home Provider: Clinic   I discussed the limitations, risks, security and privacy concerns of performing an evaluation and management service by telephone and the availability of in person appointments. I also discussed with the patient that there may be a patient responsible charge related to this service. The patient expressed understanding and agreed to proceed.  Follow Up Instructions:   I discussed the assessment and treatment plan with the patient. The patient was provided an opportunity to ask questions and all were answered. The patient agreed with the plan and demonstrated an understanding of the instructions.   The patient was advised to call back or seek an in-person evaluation if the symptoms worsen or if the condition fails to improve as anticipated.  I provided 20 minutes of non-face-to-face time during this encounter.  Meta Hatchet, PA   09/11/2021 10:28 AM Richard Roberson  MRN:  [id number]  Chief Complaint: Follow up and medication management  HPI:   Richard Roberson is a 29 year old male with a past psychiatric history significant for borderline personality disorder, PTSD, generalized anxiety disorder, major depressive disorder, and insomnia who presents to [facility] for follow-up and medication management.  Patient is currently being managed on the following medications:  Abilify 5 mg daily Sertraline 50 mg daily Trazodone 50 mg daily  Patient reports that his current medication regimen has been working well enough.  He reports that he has not been getting his 6 to 8 hours of consistent sleep through the use of trazodone recently.  He also states fluctuating emotion depending  on the day.  He reports that October was a rough month for him and now he feels that his emotions are crazy.  Patient endorses the following depressive symptoms: feelings sadness, low self-esteem, feelings of worthlessness, and lack of motivation.  He states that his change in mood stems from the recent anniversary the passing of his mother (grandmother).  Patient also admits to missing about 5 days of medication use and reports that he did not get out of bed for a whole week due to not being compliant on his medications.  Patient endorses anxiety stating that the severity of his anxiety depends on the day.  He reports that some days he experiences extremely high anxiety while on other days his anxiety is just manageable.  Patient is his current anxiety as 7 out of 10 and attributes it to the holiday season, financial instability, and work.  Patient states that he has been working roughly 5 hours/day.  Patient also reports that his thoughts of self-harm have been more frequent since the anniversary of his mother's (grandmother) passing.  A PHQ-9 screen was performed with the patient scoring a 21.  A GAD-7 screen was also performed with the patient scoring a 14.  Patient is alert and oriented x4, calm, cooperative, and fully engaged in conversation during the encounter.  Patient states that he feels very anxious, fidgety, and feels like something could go wrong at any minute.  Patient denies suicidal or homicidal ideations.  He denies active auditory or visual hallucinations but states that on occasion, he experiences people calling his name.  Patient does not appear to be responding to internal/external stimuli.  Patient endorses fair sleep  and receives on average 6 hours of sleep each night.  Patient endorses fair appetite and eats on average 1-2 meals per day.  Patient endorses occasional alcohol consumption.  Patient denies tobacco use but states that he has been vaping frequently.  Patient denies illicit  drug use.  Visit Diagnosis:    ICD-10-CM   1. Current moderate episode of major depressive disorder, unspecified whether recurrent (HCC)  F32.1 ARIPiprazole (ABILIFY) 5 MG tablet    sertraline (ZOLOFT) 50 MG tablet    2. PTSD (post-traumatic stress disorder)  F43.10 sertraline (ZOLOFT) 50 MG tablet    3. GAD (generalized anxiety disorder)  F41.1 sertraline (ZOLOFT) 50 MG tablet    4. Insomnia, unspecified type  G47.00 traZODone (DESYREL) 100 MG tablet      Past Psychiatric History:  Borderline personality disorder Generalized anxiety disorder PTSD Major depressive disorder Insomnia  Past Medical History:  Past Medical History:  Diagnosis Date   Asthma    as a child   Depression    Suicide (HCC)     Past Surgical History:  Procedure Laterality Date   CHOLECYSTECTOMY     LEG SURGERY Right     Family Psychiatric History:  Grandmother (patient refers to his grandmother as his mother since she is the one that raised him a majority of his life). Patient reports that his mother was diagnosed with bipolar disorder and schizophrenia. Mother (Biological/birth mother) - Bipolar disorder Aunt -  Bipolar/schizophrenia Father's side -  depression and anxiety   Patient reports that alcoholism also runs in his family  Family History:  Family History  Problem Relation Age of Onset   Diabetes Mother    Heart failure Mother    Hypertension Mother    Stroke Mother     Social History:  Social History   Socioeconomic History   Marital status: Single    Spouse name: Not on file   Number of children: Not on file   Years of education: Not on file   Highest education level: Not on file  Occupational History   Not on file  Tobacco Use   Smoking status: Former    Types: E-cigarettes   Smokeless tobacco: Former    Types: Associate Professor Use: Every day   Substances: Nicotine, Flavoring  Substance and Sexual Activity   Alcohol use: Yes    Comment: socially   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   Sexual activity: Yes  Other Topics Concern   Not on file  Social History Narrative   Not on file   Social Determinants of Health   Financial Resource Strain: High Risk   Difficulty of Paying Living Expenses: Hard  Food Insecurity: Food Insecurity Present   Worried About Running Out of Food in the Last Year: Sometimes true   Ran Out of Food in the Last Year: Sometimes true  Transportation Needs: No Transportation Needs   Lack of Transportation (Medical): No   Lack of Transportation (Non-Medical): No  Physical Activity: Insufficiently Active   Days of Exercise per Week: 1 day   Minutes of Exercise per Session: 60 min  Stress: Stress Concern Present   Feeling of Stress : Very much  Social Connections: Socially Isolated   Frequency of Communication with Friends and Family: Never   Frequency of Social Gatherings with Friends and Family: Once a week   Attends Religious Services: Never   Database administrator or Organizations: No  Attends Banker Meetings: Never   Marital Status: Living with partner    Allergies:  Allergies  Allergen Reactions   Betadine [Povidone Iodine] Other (See Comments)    Had surgery and incision got infected whiling healing with cast on     Metabolic Disorder Labs: No results found for: HGBA1C, MPG No results found for: PROLACTIN No results found for: CHOL, TRIG, HDL, CHOLHDL, VLDL, LDLCALC No results found for: TSH  Therapeutic Level Labs: No results found for: LITHIUM No results found for: VALPROATE No components found for:  CBMZ  Current Medications: Current Outpatient Medications  Medication Sig Dispense Refill   albuterol (PROVENTIL HFA;VENTOLIN HFA) 108 (90 Base) MCG/ACT inhaler Inhale 1-2 puffs into the lungs every 6 (six) hours as needed for wheezing or shortness of breath. 1 Inhaler 0   ARIPiprazole (ABILIFY) 5 MG tablet Take 1 tablet (5 mg total) by mouth daily. 30 tablet  1   azithromycin (ZITHROMAX) 250 MG tablet Take 1 tablet (250 mg total) by mouth daily. Take first 2 tablets together, then 1 every day until finished. 6 tablet 0   calcium carbonate (TUMS - DOSED IN MG ELEMENTAL CALCIUM) 500 MG chewable tablet Chew 1-2 tablets by mouth as needed for indigestion or heartburn.     cyclobenzaprine (FLEXERIL) 10 MG tablet Take 1 tablet (10 mg total) by mouth 2 (two) times daily as needed for muscle spasms. 20 tablet 0   hydrOXYzine (ATARAX/VISTARIL) 25 MG tablet Take 1 tablet (25 mg total) by mouth 3 (three) times daily as needed for anxiety. 12 tablet 0   ibuprofen (ADVIL,MOTRIN) 200 MG tablet Take 800 mg by mouth every 6 (six) hours as needed for moderate pain.      lidocaine (LIDODERM) 5 % Place 1 patch onto the skin daily. Remove & Discard patch within 12 hours or as directed by MD 15 patch 0   methylPREDNISolone (MEDROL DOSEPAK) 4 MG TBPK tablet Please follow the instructions on the Dosepak to take the steroids. 21 each 0   sertraline (ZOLOFT) 50 MG tablet Take 1 tablet (50 mg total) by mouth daily. 30 tablet 1   traZODone (DESYREL) 100 MG tablet Take 1 tablet (100 mg total) by mouth at bedtime. 30 tablet 1   No current facility-administered medications for this visit.     Musculoskeletal: Strength & Muscle Tone: Unable to assess due to telemedicine visit Gait & Station: Unable to assess due to telemedicine visit Patient leans: Unable to assess due to telemedicine visit  Psychiatric Specialty Exam: Review of Systems  Psychiatric/Behavioral:  Positive for sleep disturbance. Negative for decreased concentration, dysphoric mood, hallucinations, self-injury and suicidal ideas. The patient is nervous/anxious. The patient is not hyperactive.    There were no vitals taken for this visit.There is no height or weight on file to calculate BMI.  General Appearance: Unable to assess due to telemedicine visit  Eye Contact:  Unable to assess due to telemedicine visit   Speech:  Clear and Coherent and Normal Rate  Volume:  Normal  Mood:  Anxious and Depressed  Affect:  Congruent and Depressed  Thought Process:  Coherent, Goal Directed, and Descriptions of Associations: Intact  Orientation:  Full (Time, Place, and Person)  Thought Content: WDL   Suicidal Thoughts:  No  Homicidal Thoughts:  No  Memory:  Immediate;   Good Recent;   Good Remote;   Fair  Judgement:  Good  Insight:  Good  Psychomotor Activity:  Normal  Concentration:  Concentration: Good and  Attention Span: Good  Recall:  Fair  Fund of Knowledge: Good  Language: Good  Akathisia:  NA  Handed:  Right  AIMS (if indicated): not done  Assets:  Communication Skills Desire for Improvement Housing Social Support Vocational/Educational  ADL's:  Intact  Cognition: WNL  Sleep:  Fair   Screenings: AUDIT    Advertising copywriter from 03/20/2021 in [facility]  Alcohol Use Disorder Identification Test Final Score (AUDIT) 8      GAD-7    Flowsheet Row Video Visit from 09/11/2021 in [facility] Counselor from 07/17/2021 in [facility] Video Visit from 06/22/2021 in [facility] Office Visit from 05/06/2021 in [facility] Office Visit from 03/25/2021 in [facility]  Total GAD-7 Score 14 12 18 20 20       PHQ2-9    Flowsheet Row Video Visit from 09/11/2021 in [facility] Counselor from 07/17/2021 in [facility] Video Visit from 06/22/2021 in [facility] Office Visit from 05/06/2021 in [facility] Office Visit from 03/25/2021 in [facility]  PHQ-2 Total Score 4 3 4 5 5   PHQ-9 Total Score 21 14 19 22 23       Flowsheet Row Video Visit from 09/11/2021 in [facility] Video Visit from 06/22/2021 in [facility] Office Visit from 05/06/2021 in [facility]  C-SSRS RISK CATEGORY Low Risk Low Risk Low Risk        Assessment and Plan:   Jakavion Bilodeau is a 30 year old male with a past psychiatric history significant for borderline personality disorder, PTSD, generalized anxiety disorder, major depressive disorder, and insomnia who presents to [facility] for follow-up and medication management.  Patient reports that he has been experiencing anxiety and depressive symptoms due to a combination of being off his medications and the recent anniversary of the passing of a significant loved one.  Patient reports that he has been taking his medications consistently a few days.  Patient would like to keep the majority of his medications at the dosages they are currently at but is interested in adjusting his dosage of trazodone for the management of his sleep.  Patient's trazodone to be adjusted from 50 mg to 100 mg at bedtime for the management of his sleep.  Patient's medications to be e-prescribed to pharmacy of choice.  1. Current moderate episode of major depressive disorder, unspecified whether recurrent (HCC)  - ARIPiprazole (ABILIFY) 5 MG tablet; Take 1 tablet (5 mg total) by mouth daily.  Dispense: 30 tablet; Refill: 1 - sertraline (ZOLOFT) 50 MG tablet; Take 1 tablet (50 mg total) by mouth daily.  Dispense: 30 tablet; Refill: 1  2. PTSD (post-traumatic stress disorder)  - sertraline (ZOLOFT) 50 MG tablet; Take 1 tablet (50 mg total) by mouth daily.  Dispense: 30 tablet; Refill: 1  3. GAD (generalized anxiety disorder)  - sertraline (ZOLOFT) 50 MG tablet; Take 1 tablet (50 mg total) by mouth daily.  Dispense: 30 tablet; Refill: 1  4. Insomnia, unspecified type  - traZODone (DESYREL) 100 MG tablet; Take 1 tablet (100 mg total) by mouth at bedtime.   Dispense: 30 tablet; Refill: 1  Patient to follow up in 2 months Provider spent a total of 20 minutes with the patient/reviewing the patient's chart  Meta Hatchet, PA 09/11/2021, 10:28 AM

## 2021-09-11 NOTE — Progress Notes (Signed)
   THERAPIST PROGRESS NOTE Virtual Visit via Video Note  I connected with MIRKO TAILOR on 09/11/21 at  9:00 AM EST by a video enabled telemedicine application and verified that I am speaking with the correct person using two identifiers.  Location: Patient: [facility]  Provider: [facility]    I discussed the limitations of evaluation and management by telemedicine and the availability of in person appointments. The patient expressed understanding and agreed to proceed.      I discussed the assessment and treatment plan with the patient. The patient was provided an opportunity to ask questions and all were answered. The patient agreed with the plan and demonstrated an understanding of the instructions.   The patient was advised to call back or seek an in-person evaluation if the symptoms worsen or if the condition fails to improve as anticipated.  I provided 40 minutes of non-face-to-face time during this encounter.   [name], LCSW   Participation Level: Active  Behavioral Response: Casual and Fairly GroomedAlertAnxious and Depressed  Type of Therapy: Individual Therapy  Treatment Goals addressed: Anxiety and Diagnosis: anxiety   Interventions: CBT and Motivational Interviewing  Summary: CALYN RUBI is a 30 y.o. male who presents with depressed and anxious mood\affect.  Patient was pleasant, cooperative, and maintained good eye contact.  Karell was alert and oriented x5.  Patient started out session coming in late.  LCSW had to call patient to remind him of appointment.  LCSW explained policy that virtual appointments needed to be maintained on time.  Patient was agreeable to policies and procedures moving forward.  Patient reports stressors for work, school, and family conflict.  Patient reports that he has been working more hours but getting less pay.  Patient currently works for a food delivery service and due to the holiday season there  have been less "surge charges".  Patient was at 1 time working about 50 to 60 hours a week making 1500 every 2 weeks, however patient is now working that same amount of time and only making $800 per week.  This has led to patient applying to multiple jobs.  Patient also reports stressors for family conflict.  Patient reports that his sister was living with him and has recently moved out.  He reports that he asked her not to move out on the anniversary of their mother's death.  But sister declined to listen and moved out on that day.  Patient reports and endorses symptoms for depression as irritability, overeating, insomnia, worthlessness, and hopelessness.  Patient also endorses symptoms for anxiety for tension and worry.  Suicidal/Homicidal: without intent/plan  Therapist Response:    Intervention/Plan: Interventions utilized in today's session were supportive therapy, person centered therapy, and motivational interviewing.  LCSW utilized positive affirmations for motivational interviewing.  LCSW also provided patient with solution focused therapy for providing patient resources to [place] works.  LCSW utilized language for praise, encouragement, and empowerment in today's session.  Plan for patient is to walk daily.  Patient to apply for at least 5 jobs weekly.  Patient to follow-up with [place] works 1 time in the next 30 days.  Plan: Return again in 4 weeks.      [name], LCSW 09/11/2021

## 2021-09-14 ENCOUNTER — Encounter (HOSPITAL_COMMUNITY): Payer: Self-pay | Admitting: Physician Assistant

## 2021-10-16 ENCOUNTER — Ambulatory Visit (INDEPENDENT_AMBULATORY_CARE_PROVIDER_SITE_OTHER): Payer: No Payment, Other | Admitting: Licensed Clinical Social Worker

## 2021-10-16 DIAGNOSIS — F4310 Post-traumatic stress disorder, unspecified: Secondary | ICD-10-CM

## 2021-10-16 DIAGNOSIS — F411 Generalized anxiety disorder: Secondary | ICD-10-CM

## 2021-10-16 DIAGNOSIS — F321 Major depressive disorder, single episode, moderate: Secondary | ICD-10-CM

## 2021-10-16 DIAGNOSIS — F603 Borderline personality disorder: Secondary | ICD-10-CM

## 2021-10-16 NOTE — Progress Notes (Signed)
° °  THERAPIST PROGRESS NOTE  Virtual Visit via Video Note  I connected with Richard Roberson on 10/16/21 at  8:00 AM EST by a video enabled telemedicine application and verified that I am speaking with the correct person using two identifiers.  Location: Patient: [facility]  Provider: [facility]    I discussed the limitations of evaluation and management by telemedicine and the availability of in person appointments. The patient expressed understanding and agreed to proceed.     I discussed the assessment and treatment plan with the patient. The patient was provided an opportunity to ask questions and all were answered. The patient agreed with the plan and demonstrated an understanding of the instructions.   The patient was advised to call back or seek an in-person evaluation if the symptoms worsen or if the condition fails to improve as anticipated.  I provided 40 minutes of non-face-to-face time during this encounter.   [name], LCSW   Participation Level: Active  Behavioral Response: CasualAlertAnxious and Depressed  Type of Therapy: Individual Therapy  Treatment Goals addressed: Anxiety, Coping, and Diagnosis: depression   Interventions: CBT, Motivational Interviewing, and Strength-based   Suicidal/Homicidal: Nowithout intent/plan  Therapist Response:    Pt was alert and oriented x 5. He was dressed casually and engaged well in therapy session. [name] was pleasant, cooperative, and maintained good eye contact. He maintained good eye contact in today's session.   Primary stressors are work, Education officer, community, and relationship. Pt reports that his fianc broke up with him 2 weeks ago due to lack of self-improvement. Pt reports examples for financials such as paying bills on time. Pt reports that he still lives with his fianc but sleeps on the couch. He reports that he has struggled with his depression with passive SI thoughts with no plan or intent,  isolation lack of motivation, isolation, hopelessness, and worthlessness. He states that he is not taking his medications as they cost too much, but states he is working on getting a job to help ease the financial burdens he has.   Intervention/Plan:  CBT, motivational interviewing, and person center therapy were used in today's session. CSW used positive affirmation to help encourage and empower pt to take steps of self-improvement in his mental health. LCSW used restructuring to help with pt negative mind set of I cannot do it and changing that to I can do it. Plan for pt moving forward to have 4/7 good days per week these are days pt believes he has accomplished his tasks. Pt to utilize a weekly planner with at least 2 tasks per day. [place] to f/u in 4 weeks.   Plan: Return again in 4 weeks.     [name], LCSW 10/16/2021

## 2021-11-06 ENCOUNTER — Ambulatory Visit (HOSPITAL_COMMUNITY): Payer: No Payment, Other | Admitting: Licensed Clinical Social Worker

## 2021-11-13 ENCOUNTER — Telehealth (HOSPITAL_COMMUNITY): Payer: No Payment, Other | Admitting: Physician Assistant

## 2021-12-04 ENCOUNTER — Ambulatory Visit (HOSPITAL_COMMUNITY): Payer: No Payment, Other | Admitting: Licensed Clinical Social Worker

## 2021-12-14 ENCOUNTER — Ambulatory Visit (INDEPENDENT_AMBULATORY_CARE_PROVIDER_SITE_OTHER): Payer: No Payment, Other | Admitting: Licensed Clinical Social Worker

## 2021-12-14 DIAGNOSIS — F331 Major depressive disorder, recurrent, moderate: Secondary | ICD-10-CM

## 2021-12-14 DIAGNOSIS — F411 Generalized anxiety disorder: Secondary | ICD-10-CM

## 2021-12-14 DIAGNOSIS — F4310 Post-traumatic stress disorder, unspecified: Secondary | ICD-10-CM | POA: Diagnosis not present

## 2021-12-14 NOTE — Progress Notes (Signed)
? ?THERAPIST PROGRESS NOTE ? ?Virtual Visit via Video Note ? ?I connected with KENLY XIAO on 12/14/21 at  3:00 PM EST by a video enabled telemedicine application and verified that I am speaking with the correct person using two identifiers. ? ?Location: ?Patient: Richard Roberson  ?Provider: [facility]  ?  ?I discussed the limitations of evaluation and management by telemedicine and the availability of in person appointments. The patient expressed understanding and agreed to proceed. ? ? ?I discussed the assessment and treatment plan with the patient. The patient was provided an opportunity to ask questions and all were answered. The patient agreed with the plan and demonstrated an understanding of the instructions. ?  ?The patient was advised to call back or seek an in-person evaluation if the symptoms worsen or if the condition fails to improve as anticipated. ? ?I provided 40 minutes of non-face-to-face time during this encounter. ? ? ?[name], LCSW  ? ?Participation Level: Active ? ?Behavioral Response: CasualAlertAnxious ? ?Type of Therapy: Individual Therapy ? ?Treatment Goals addressed: Treatment plan update for better compliance with insurance regulations. STG: Patient will attend at least 80% of scheduled group psychotherapy  ?sessions ? ?ProgressTowards Goals: Initial ? ?Interventions: CBT and Motivational Interviewing ? ? ?Suicidal/Homicidal: Nowithout intent/plan ? ?Therapist Response:  ? ? ?Patient was alert and oriented x3.  Patient presented today with anxious and depressed mood\affect.  He engaged well in therapy session and was dressed casually.  Patient was pleasant, cooperative, maintained good eye contact. ? Richard Roberson comes in today with primary stressors of relationship, work, illness.  Patient reports that he started a job 1 month ago and got hurt dropping a pallet on his foot.  Patient reports that the foot has not gotten better and he is now placed on Workmen's Comp.   Patient states that the injury will take about 4 weeks to heal.  Patient reports that he has not been paid since his first paycheck and will not be paid through Workmen's Comp.  Patient reports that he has not gotten a check from [employer]. as of yet but they state that it is mailed.  Patient reports tension and worry in regards to his financial situation due to the injury.  Patient reports that he continues to work on his relationship with his ex significant other [place].  Patient states that he has been working hard on his mental health.  Patient reports that they essentially have "just started over" ? ? Intervention/Plan: LCSW updated treatment plan to comply with insurance regulations.  LCSW administered supportive therapy for praise, encouragement, and advocacy.  LCSW spoke with patient about the importance of taking medications.  1 goal and objective updated in treatment plan will be to take medications as directed.  Patient reports that he has not been taking his medications as of the last 3 weeks.  Patient reports "I forgot about them".  He states that he started to feel more depressed and anxious several weeks after not taking the medications.  Patient is agreeable to start back up on the medications.  Patient is only no showed 1 time.  1 of goals and objectives for patient is to attend 80% of individual therapy.  Patient still remains above 80%. ? ?Plan: Return again in 4 weeks. ? ?Diagnosis: PTSD (post-traumatic stress disorder) ? ?GAD (generalized anxiety disorder) ? ?Moderate episode of recurrent major depressive disorder (HCC) ? ?Collaboration of Care: Other none in this session ? ?Patient/Guardian was advised Release of Information must be  obtained prior to any record release in order to collaborate their care with an outside provider. Patient/Guardian was advised if they have not already done so to contact the registration department to sign all necessary forms in order for [place] to release  information regarding their care.  ? ?Consent: Patient/Guardian gives verbal consent for treatment and assignment of benefits for services provided during this visit. Patient/Guardian expressed understanding and agreed to proceed.  ? ?[name], LCSW ?12/14/2021 ? ?

## 2021-12-14 NOTE — Plan of Care (Signed)
Pt agreeable to updated treatment plan.  °

## 2021-12-16 ENCOUNTER — Telehealth (INDEPENDENT_AMBULATORY_CARE_PROVIDER_SITE_OTHER): Payer: No Payment, Other | Admitting: Physician Assistant

## 2021-12-16 ENCOUNTER — Encounter (HOSPITAL_COMMUNITY): Payer: Self-pay | Admitting: Physician Assistant

## 2021-12-16 DIAGNOSIS — F4310 Post-traumatic stress disorder, unspecified: Secondary | ICD-10-CM | POA: Diagnosis not present

## 2021-12-16 DIAGNOSIS — F321 Major depressive disorder, single episode, moderate: Secondary | ICD-10-CM | POA: Diagnosis not present

## 2021-12-16 DIAGNOSIS — G4700 Insomnia, unspecified: Secondary | ICD-10-CM

## 2021-12-16 DIAGNOSIS — F411 Generalized anxiety disorder: Secondary | ICD-10-CM | POA: Diagnosis not present

## 2021-12-16 MED ORDER — SERTRALINE HCL 50 MG PO TABS: 50.0000 mg | ORAL_TABLET | Freq: Every day | ORAL | 1 refills | Status: DC

## 2021-12-16 MED ORDER — ARIPIPRAZOLE 5 MG PO TABS: 5.0000 mg | ORAL_TABLET | Freq: Every day | ORAL | 1 refills | Status: DC

## 2021-12-16 MED ORDER — TRAZODONE HCL 100 MG PO TABS: 100.0000 mg | ORAL_TABLET | Freq: Every day | ORAL | 1 refills | Status: DC

## 2021-12-16 NOTE — Progress Notes (Cosign Needed)
BH MD/PA/NP OP Progress Note  Virtual Visit via Video Note  I connected with Richard Roberson on 12/16/21 at  4:30 PM EST by a video enabled telemedicine application and verified that I am speaking with the correct person using two identifiers.  Location: Patient: Home Provider: Clinic   I discussed the limitations of evaluation and management by telemedicine and the availability of in person appointments. The patient expressed understanding and agreed to proceed.  Follow Up Instructions:  I discussed the assessment and treatment plan with the patient. The patient was provided an opportunity to ask questions and all were answered. The patient agreed with the plan and demonstrated an understanding of the instructions.   The patient was advised to call back or seek an in-person evaluation if the symptoms worsen or if the condition fails to improve as anticipated.  I provided 28 minutes of non-face-to-face time during this encounter.  Richard Roberson Weight, PA    12/16/2021 9:30 PM Richard Roberson  MRN:  [id number]  Chief Complaint:  Chief Complaint  Patient presents with   Medication Refill   Follow-up   HPI:   Richard Roberson  Visit Diagnosis:    ICD-10-CM   1. Current moderate episode of major depressive disorder, unspecified whether recurrent (HCC)  F32.1 ARIPiprazole (ABILIFY) 5 MG tablet    sertraline (ZOLOFT) 50 MG tablet    2. PTSD (post-traumatic stress disorder)  F43.10 sertraline (ZOLOFT) 50 MG tablet    3. GAD (generalized anxiety disorder)  F41.1 sertraline (ZOLOFT) 50 MG tablet    4. Insomnia, unspecified type  G47.00 traZODone (DESYREL) 100 MG tablet      Past Psychiatric History:  Borderline personality disorder Generalized anxiety disorder PTSD Major depressive disorder Insomnia  Past Medical History:  Past Medical History:  Diagnosis Date   Asthma    as a child   Depression    Suicide (HCC)     Past Surgical History:  Procedure  Laterality Date   CHOLECYSTECTOMY     LEG SURGERY Right     Family Psychiatric History:  Grandmother (patient refers to his grandmother as his mother since she is the one that raised him a majority of his life). Patient reports that his mother was diagnosed with bipolar disorder and schizophrenia. Mother (Biological/birth mother) - Bipolar disorder Aunt -  Bipolar/schizophrenia Father's side -  depression and anxiety   Patient reports that alcoholism also runs in his family  Family History:  Family History  Problem Relation Age of Onset   Diabetes Mother    Heart failure Mother    Hypertension Mother    Stroke Mother     Social History:  Social History   Socioeconomic History   Marital status: Single    Spouse name: Not on file   Number of children: Not on file   Years of education: Not on file   Highest education level: Not on file  Occupational History   Not on file  Tobacco Use   Smoking status: Former    Types: E-cigarettes   Smokeless tobacco: Former    Types: Associate ProfessorChew  Vaping Use   Vaping Use: Every day   Substances: Nicotine, Flavoring  Substance and Sexual Activity   Alcohol use: Yes    Comment: socially   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   Sexual activity: Yes  Other Topics Concern   Not on file  Social History Narrative   Not on file  Social Determinants of Health   Financial Resource Strain: High Risk   Difficulty of Paying Living Expenses: Hard  Food Insecurity: Food Insecurity Present   Worried About Running Out of Food in the Last Year: Sometimes true   Ran Out of Food in the Last Year: Sometimes true  Transportation Needs: No Transportation Needs   Lack of Transportation (Medical): No   Lack of Transportation (Non-Medical): No  Physical Activity: Insufficiently Active   Days of Exercise per Week: 1 day   Minutes of Exercise per Session: 60 min  Stress: Stress Concern Present   Feeling of Stress : Very much  Social  Connections: Socially Isolated   Frequency of Communication with Friends and Family: Never   Frequency of Social Gatherings with Friends and Family: Once a week   Attends Religious Services: Never   Database administrator or Organizations: No   Attends Banker Meetings: Never   Marital Status: Living with partner    Allergies:  Allergies  Allergen Reactions   Betadine [Povidone Iodine] Other (See Comments)    Had surgery and incision got infected whiling healing with cast on     Metabolic Disorder Labs: No results found for: HGBA1C, MPG No results found for: PROLACTIN No results found for: CHOL, TRIG, HDL, CHOLHDL, VLDL, LDLCALC No results found for: TSH  Therapeutic Level Labs: No results found for: LITHIUM No results found for: VALPROATE No components found for:  CBMZ  Current Medications: Current Outpatient Medications  Medication Sig Dispense Refill   albuterol (PROVENTIL HFA;VENTOLIN HFA) 108 (90 Base) MCG/ACT inhaler Inhale 1-2 puffs into the lungs every 6 (six) hours as needed for wheezing or shortness of breath. 1 Inhaler 0   ARIPiprazole (ABILIFY) 5 MG tablet Take 1 tablet (5 mg total) by mouth daily. 30 tablet 1   azithromycin (ZITHROMAX) 250 MG tablet Take 1 tablet (250 mg total) by mouth daily. Take first 2 tablets together, then 1 every day until finished. 6 tablet 0   calcium carbonate (TUMS - DOSED IN MG ELEMENTAL CALCIUM) 500 MG chewable tablet Chew 1-2 tablets by mouth as needed for indigestion or heartburn.     cyclobenzaprine (FLEXERIL) 10 MG tablet Take 1 tablet (10 mg total) by mouth 2 (two) times daily as needed for muscle spasms. 20 tablet 0   hydrOXYzine (ATARAX/VISTARIL) 25 MG tablet Take 1 tablet (25 mg total) by mouth 3 (three) times daily as needed for anxiety. 12 tablet 0   ibuprofen (ADVIL,MOTRIN) 200 MG tablet Take 800 mg by mouth every 6 (six) hours as needed for moderate pain.      lidocaine (LIDODERM) 5 % Place 1 patch onto the skin  daily. Remove & Discard patch within 12 hours or as directed by MD 15 patch 0   methylPREDNISolone (MEDROL DOSEPAK) 4 MG TBPK tablet Please follow the instructions on the Dosepak to take the steroids. 21 each 0   sertraline (ZOLOFT) 50 MG tablet Take 1 tablet (50 mg total) by mouth daily. 30 tablet 1   traZODone (DESYREL) 100 MG tablet Take 1 tablet (100 mg total) by mouth at bedtime. 30 tablet 1   No current facility-administered medications for this visit.     Musculoskeletal: Strength & Muscle Tone: Unable to assess due to telemedicine visit Gait & Station: Unable to assess due to telemedicine visit Patient leans: Unable to assess due to telemedicine visit  Psychiatric Specialty Exam: Review of Systems  Psychiatric/Behavioral:  Positive for hallucinations and sleep disturbance. Negative for  decreased concentration, dysphoric mood, self-injury and suicidal ideas. The patient is nervous/anxious. The patient is not hyperactive.    There were no vitals taken for this visit.There is no height or weight on file to calculate BMI.  General Appearance: Casual  Eye Contact:  Good  Speech:  Clear and Coherent and Normal Rate  Volume:  Normal  Mood:  Anxious and Depressed  Affect:  Congruent  Thought Process:  Coherent, Goal Directed, and Descriptions of Associations: Intact  Orientation:  Full (Time, Place, and Person)  Thought Content: WDL and Hallucinations: Visual   Suicidal Thoughts:  No  Homicidal Thoughts:  No  Memory:  Immediate;   Good Recent;   Good Remote;   Fair  Judgement:  Good  Insight:  Good  Psychomotor Activity:  Normal  Concentration:  Concentration: Good and Attention Span: Good  Recall:  Fair  Fund of Knowledge: Good  Language: Good  Akathisia:  No  Handed:  Right  AIMS (if indicated): not done  Assets:  Communication Skills Desire for Improvement Housing Social Support Vocational/Educational  ADL's:  Intact  Cognition: WNL  Sleep:  Fair    Screenings: AUDIT    Advertising copywriter from 03/20/2021 in [facility]  Alcohol Use Disorder Identification Test Final Score (AUDIT) 8      GAD-7    Flowsheet Row Video Visit from 12/16/2021 in [facility] Counselor from 12/14/2021 in [facility] Video Visit from 09/11/2021 in [facility] Counselor from 07/17/2021 in [facility] Video Visit from 06/22/2021 in [facility]  Total GAD-7 Score 14 13 14 12 18       PHQ2-9    Flowsheet Row Video Visit from 12/16/2021 in [facility] Counselor from 12/14/2021 in [facility] Video Visit from 09/11/2021 in [facility] Counselor from 07/17/2021 in [facility] Video Visit from 06/22/2021 in [facility]  PHQ-2 Total Score 4 4 4 3 4   PHQ-9 Total Score 17 14 21 14 19       Flowsheet Row Video Visit from 12/16/2021 in [facility] Video Visit from 09/11/2021 in [facility] Video Visit from 06/22/2021 in [facility]  C-SSRS RISK CATEGORY Low Risk Low Risk Low Risk        Assessment and Plan:   Collaboration of Care: Collaboration of Care: Medication Management AEB provider managing patient's medications, Psychiatrist AEB patient being followed by a mental health provider, and Referral or follow-up with counselor/therapist AEB patient seen by a licensed clinical social worker at this facility  Patient/Guardian was advised Release of Information must be obtained prior to any record release in order to collaborate their care with an outside provider. Patient/Guardian was advised if they have not already done so to contact the registration department to sign all  necessary forms in order for [facility] to release information regarding their care.   Consent: Patient/Guardian gives verbal consent for treatment and assignment of benefits for services provided during this visit. Patient/Guardian expressed understanding and agreed to proceed.   1. Current moderate episode of major depressive disorder, unspecified whether recurrent (HCC)  - ARIPiprazole (ABILIFY) 5 MG tablet; Take 1 tablet (5 mg total) by mouth daily.  Dispense: 30 tablet; Refill: 1 - sertraline (ZOLOFT) 50 MG tablet; Take 1 tablet (50 mg total) by mouth  daily.  Dispense: 30 tablet; Refill: 1  2. PTSD (post-traumatic stress disorder)  - sertraline (ZOLOFT) 50 MG tablet; Take 1 tablet (50 mg total) by mouth daily.  Dispense: 30 tablet; Refill: 1  3. GAD (generalized anxiety disorder)  - sertraline (ZOLOFT) 50 MG tablet; Take 1 tablet (50 mg total) by mouth daily.  Dispense: 30 tablet; Refill: 1  4. Insomnia, unspecified type  - traZODone (DESYREL) 100 MG tablet; Take 1 tablet (100 mg total) by mouth at bedtime.  Dispense: 30 tablet; Refill: 1  Patient to follow up in 6 weeks Provider spent a total of 28 minutes with the patient/reviewing patient's chart  Richard Hatchet, PA 12/16/2021, 9:30 PM

## 2021-12-31 ENCOUNTER — Ambulatory Visit (INDEPENDENT_AMBULATORY_CARE_PROVIDER_SITE_OTHER): Payer: No Payment, Other | Admitting: Licensed Clinical Social Worker

## 2021-12-31 DIAGNOSIS — F331 Major depressive disorder, recurrent, moderate: Secondary | ICD-10-CM

## 2021-12-31 DIAGNOSIS — F4310 Post-traumatic stress disorder, unspecified: Secondary | ICD-10-CM

## 2021-12-31 DIAGNOSIS — F411 Generalized anxiety disorder: Secondary | ICD-10-CM

## 2021-12-31 NOTE — Progress Notes (Signed)
? ?  THERAPIST PROGRESS NOTE ? ?Virtual Visit via Video Note ? ?I connected with Richard Roberson on 12/31/21 at  3:00 PM EDT by a video enabled telemedicine application and verified that I am speaking with the correct person using two identifiers. ? ?Location: ?Patient: Richard Roberson  ?Provider: Providers Home  ?  ?I discussed the limitations of evaluation and management by telemedicine and the availability of in person appointments. The patient expressed understanding and agreed to proceed. ? ? ?  ?I discussed the assessment and treatment plan with the patient. The patient was provided an opportunity to ask questions and all were answered. The patient agreed with the plan and demonstrated an understanding of the instructions. ?  ?The patient was advised to call back or seek an in-person evaluation if the symptoms worsen or if the condition fails to improve as anticipated. ? ?I provided 40 minutes of non-face-to-face time during this encounter. ? ? ?Richard Horn, LCSW  ? ?Participation Level: Active ? ?Behavioral Response: CasualAlertAnxious and Depressed ? ?Type of Therapy: Individual Therapy ? ?Treatment Goals addressed:  ? ?ProgressTowards Goals: Progressing ? ?Interventions: CBT, Motivational Interviewing, and Supportive ? ? ?Suicidal/Homicidal: Nowithout intent/plan ? ?Therapist Response:  ? ? Pt was alert and oriented x 5. He was dressed casually and engaged well in therapy session. Pt was pleasant, cooperative, and maintained good eye contact. Pt presented with depressed and anxious mood/affect.  ? Primary stressor for pt are food, illness, and financial. Pt reports that he has been dealing with a foot injury which currently has him or workmen's comp. He reports that he must complete PT and OT before he can return back to work. Pt reports that he needs to complete 2 to 3 more weeks before he could be cleared for work. Other stressor is that pt is out of work and he reports workman's comp is only pay  60% of his weekly pay. Pt report that this was covering his bills until his car broke down and needs a new starter. Pt reports poor appetite going days with no appetite only to binge eat 2 days per week.   ? Interventions/Plan: LCSW administered a PHQ-9 and pt decreased score from 17 to 14. Goal/objective is below 10. LCSW administered a GAD-7 and pt decreased score from 14 to 10. Goal for GAD-7 is below a five. LCSW spoke with pt about balancing out his diet to better give him the fuel needed for his body to cope with mental and physical health. Pt goal is to eat three meals daily. Plan for pt is to f/u in 4 weeks   ? ?Plan: Return again in 4 weeks. ? ?Diagnosis: Moderate episode of recurrent major depressive disorder (Richard Roberson) ? ?GAD (generalized anxiety disorder) ? ?PTSD (post-traumatic stress disorder) ? ?Collaboration of Care: Other None today  ? ?Patient/Guardian was advised Release of Information must be obtained prior to any record release in order to collaborate their care with an outside provider. Patient/Guardian was advised if they have not already done so to contact the registration department to sign all necessary forms in order for [place] to release information regarding their care.  ? ?Consent: Patient/Guardian gives verbal consent for treatment and assignment of benefits for services provided during this visit. Patient/Guardian expressed understanding and agreed to proceed.  ? ?Richard Horn, LCSW ?12/31/2021 ? ?

## 2022-01-12 ENCOUNTER — Ambulatory Visit (INDEPENDENT_AMBULATORY_CARE_PROVIDER_SITE_OTHER): Payer: No Payment, Other | Admitting: Licensed Clinical Social Worker

## 2022-01-12 ENCOUNTER — Encounter (HOSPITAL_COMMUNITY): Payer: Self-pay

## 2022-01-12 DIAGNOSIS — F411 Generalized anxiety disorder: Secondary | ICD-10-CM | POA: Diagnosis not present

## 2022-01-12 DIAGNOSIS — F331 Major depressive disorder, recurrent, moderate: Secondary | ICD-10-CM

## 2022-01-12 NOTE — Progress Notes (Signed)
? ?THERAPIST PROGRESS NOTE ? ?Session Time: 40 ? ?Virtual Visit via Video Note ? ?I connected with Richard Roberson on 01/12/22 at  4:00 PM EDT by a video enabled telemedicine application and verified that I am speaking with the correct person using two identifiers. ? ?Location: ?Patient: [facility]  ?Provider: Providers Home  ?  ?I discussed the limitations of evaluation and management by telemedicine and the availability of in person appointments. The patient expressed understanding and agreed to proceed. ? ? ? ?  ?I discussed the assessment and treatment plan with the patient. The patient was provided an opportunity to ask questions and all were answered. The patient agreed with the plan and demonstrated an understanding of the instructions. ?  ?The patient was advised to call back or seek an in-person evaluation if the symptoms worsen or if the condition fails to improve as anticipated. ? ?I provided 40 minutes of non-face-to-face time during this encounter. ? ? ?[name], LCSW  ? ?Participation Level: Active ? ?Behavioral Response: CasualAlertAnxious and Depressed ? ?Type of Therapy: Individual Therapy ? ?Treatment Goals addressed: Identify three trigger causing anxiety ? ?ProgressTowards Goals: Progressing ? ?Interventions: CBT, Motivational Interviewing, and Supportive ? ?Summary: Richard Roberson is a 31 y.o. male who presents with depressed and anxious mood\affect.  Patient was tearful throughout session.  He presented as cooperative, pleasant, and engaged well in today's session.  Patient was dressed casually and maintained good eye contact. ? Patient reports primary stressors of his relationship.  Patient reports that he was on a break from his significant other but were still staying in the same household just in separate rooms.  Patient reports that he is in a polyamorous relationship and decided to engage in an online dating form.  Patient reports that he is now asked significant  other found out that his activity online and has officially broken things off.  Patient reports some "dark thoughts".  LCSW did assess patient via [place] suicide scale and patient scored at low risk.  LCSW did contract patient for safety either going to the behavioral health urgent care on [address] in [place], [place] or contacting suicide hotline by dialing [phone] on the phone. ? ?Suicidal/Homicidal: Nowithout intent/plan ? ?Therapist Response:  ? ? LCSW utilized psychotherapy for patient to express thoughts, feelings, emotions in today's sessions.  LCSW utilized silence in today's session as patient needed to express his thoughts feelings and emotions.  LCSW utilized reframing for turning negative thoughts into +ones.  LCSW did a dental 5 1 goal in today's session which was identify 3 triggers causing anxiety.  LCSW notes that patient identified 1 trigger as his ex relationship.  LCSW educated patient on partialization.  Patient to focus on 3 things moving forward 1 moving out of his ex significant other's household, 2 fixing car and 3 his rehabilitation for his foot. ?  ? ?Plan: Return again in 4 weeks. ? ?Diagnosis: GAD (generalized anxiety disorder) ? ?Moderate episode of recurrent major depressive disorder (HCC) ? ?Collaboration of Care: Other None in today's session  ? ?Patient/Guardian was advised Release of Information must be obtained prior to any record release in order to collaborate their care with an outside provider. Patient/Guardian was advised if they have not already done so to contact the registration department to sign all necessary forms in order for [place] to release information regarding their care.  ? ?Consent: Patient/Guardian gives verbal consent for treatment and assignment of benefits for services provided during this visit.  Patient/Guardian expressed understanding and agreed to proceed.  ? ?[name], LCSW ?01/12/2022 ? ?

## 2022-01-12 NOTE — Plan of Care (Signed)
?  Problem: Anxiety Disorder CCP Problem  1 GAD ?Goal:  walk 3 x weekly ?Outcome: Not Progressing ?Goal: Identify three trigger causing anxiety  ?Outcome: Progressing ?Goal: LTG: Patient will score less than 5 on the Generalized Anxiety Disorder 7 Scale (GAD-7) ?Outcome: Not Progressing ?Goal: STG: Patient will attend at least 80% of scheduled group psychotherapy sessions ?Outcome: Progressing ?Goal: STG: Patient will practice problem solving skills 3 times per week for the next 4 weeks ?Outcome: Progressing ?  ?Problem: Depression CCP Problem  1 MDD ?Goal: identify three trigger causing depression.  ?Outcome: Progressing ?Goal: LTG: Richard Roberson WILL SCORE LESS THAN 10 ON THE PATIENT HEALTH QUESTIONNAIRE (PHQ-9) ?Outcome: Not Progressing ?  ?

## 2022-01-27 ENCOUNTER — Ambulatory Visit (INDEPENDENT_AMBULATORY_CARE_PROVIDER_SITE_OTHER): Payer: No Payment, Other | Admitting: Licensed Clinical Social Worker

## 2022-01-27 DIAGNOSIS — F321 Major depressive disorder, single episode, moderate: Secondary | ICD-10-CM | POA: Diagnosis not present

## 2022-01-27 DIAGNOSIS — F4310 Post-traumatic stress disorder, unspecified: Secondary | ICD-10-CM

## 2022-01-27 DIAGNOSIS — F411 Generalized anxiety disorder: Secondary | ICD-10-CM

## 2022-01-27 NOTE — Progress Notes (Signed)
? ?THERAPIST PROGRESS NOTE ? ?Virtual Visit via Video Note ? ?I connected with Richard Roberson on 01/27/22 at  9:00 AM EDT by a video enabled telemedicine application and verified that I am speaking with the correct person using two identifiers. ? ?Location: ?Patient: Richard Roberson  ?Provider: Provider Home  ?  ?I discussed the limitations of evaluation and management by telemedicine and the availability of in person appointments. The patient expressed understanding and agreed to proceed. ? ? ?I discussed the assessment and treatment plan with the patient. The patient was provided an opportunity to ask questions and all were answered. The patient agreed with the plan and demonstrated an understanding of the instructions. ?  ?The patient was advised to call back or seek an in-person evaluation if the symptoms worsen or if the condition fails to improve as anticipated. ? ?I provided 40 minutes of non-face-to-face time during this encounter. ? ? ?[name], LCSW  ? ?Participation Level: Active ? ?Behavioral Response: CasualAlertAnxious and Depressed ? ?Type of Therapy: Individual Therapy ? ?Treatment Goals addressed:  ? ?ProgressTowards Goals: Progressing ? ?Interventions: CBT, Motivational Interviewing, and Supportive ? ?Summary: Richard Roberson is a 31 y.o. male who presents with depressed, tearful, anxious mood\affect.  Patient was pleasant, cooperative, maintained good eye contact.  He engaged well in therapy session and was dressed casually. ? Primary stressor for patient is ex relationship, financials, and transportation.  Patient reports that his car needs to be fixed and it will cost $2000 which she does not currently have.  Patient reports that his mechanic is looking into programs that might be able to loan him the money and will find out more on Friday.  Patient reports backup plan of obtaining a loan from the bank if possible.  Patient was dysphoric in today's session, he reports that he  was feeling hopeless and worthless since his break-up.  Patient reports that he has 60 more days to find housing before he has to live in his car.  Patient reports that he has not gotten the support system that he thought he had with friends suggesting that he stay in a shelter or in his car.  Patient reports that he does have 1 option to park his car outside of a friend's house and is able to use the bathroom in the house when necessary.  ? ?Suicidal/Homicidal: Nowithout intent/plan ? ?Therapist Response:  ? ? Intervention/Plan: LCSW administered the GAD-7.  LCSW administered PHQ-9.  LCSW went over scores of GAD-7 and PHQ-9.  LCSW notes an increase from GAD-7 from a 10 to a 15.  LCSW also notes an increase from a 14 to a 17 on PHQ-9.  LCSW used language for empowerment, praise, and encouragement.  LCSW utilized motivational interviewing by utilizing open ended questions and unconditional positive regard.  Plan for patient is to stay solution focused on transportation situation and getting his car fixed.  Plan is for patient to follow-up with mechanic on Friday and have a backup plan of following up with the bank on Monday ? ?Plan: Return again in 4 weeks. ? ?Diagnosis: Current moderate episode of major depressive disorder without prior episode (HCC) ? ?GAD (generalized anxiety disorder) ? ?PTSD (post-traumatic stress disorder) ? ?Collaboration of Care: Other none in todays session  ? ?Patient/Guardian was advised Release of Information must be obtained prior to any record release in order to collaborate their care with an outside provider. Patient/Guardian was advised if they have not already done so to contact  the registration department to sign all necessary forms in order for [place] to release information regarding their care.  ? ?Consent: Patient/Guardian gives verbal consent for treatment and assignment of benefits for services provided during this visit. Patient/Guardian expressed understanding and agreed to  proceed.  ? ?[name], LCSW ?01/27/2022 ? ?

## 2022-01-28 ENCOUNTER — Encounter (HOSPITAL_COMMUNITY): Payer: Self-pay | Admitting: Physician Assistant

## 2022-01-28 ENCOUNTER — Telehealth (INDEPENDENT_AMBULATORY_CARE_PROVIDER_SITE_OTHER): Payer: No Payment, Other | Admitting: Physician Assistant

## 2022-01-28 DIAGNOSIS — G4700 Insomnia, unspecified: Secondary | ICD-10-CM

## 2022-01-28 DIAGNOSIS — F4310 Post-traumatic stress disorder, unspecified: Secondary | ICD-10-CM | POA: Diagnosis not present

## 2022-01-28 DIAGNOSIS — F321 Major depressive disorder, single episode, moderate: Secondary | ICD-10-CM | POA: Diagnosis not present

## 2022-01-28 DIAGNOSIS — F411 Generalized anxiety disorder: Secondary | ICD-10-CM | POA: Diagnosis not present

## 2022-01-28 MED ORDER — SERTRALINE HCL 100 MG PO TABS: 100.0000 mg | ORAL_TABLET | Freq: Every day | ORAL | 2 refills | Status: DC

## 2022-01-28 MED ORDER — ARIPIPRAZOLE 10 MG PO TABS: 10.0000 mg | ORAL_TABLET | Freq: Every day | ORAL | 2 refills | Status: DC

## 2022-01-28 MED ORDER — TRAZODONE HCL 100 MG PO TABS: 100.0000 mg | ORAL_TABLET | Freq: Every day | ORAL | 1 refills | Status: DC

## 2022-01-28 NOTE — Progress Notes (Addendum)
BH MD/PA/NP OP Progress Note ? ?Virtual Visit via Video Note ? ?I connected with Richard Roberson on 01/28/22 at  4:30 PM EDT by a video enabled telemedicine application and verified that I am speaking with the correct person using two identifiers. ? ?Location: ?Patient: Home ?Provider: Clinic ?  ?I discussed the limitations of evaluation and management by telemedicine and the availability of in person appointments. The patient expressed understanding and agreed to proceed. ? ?Follow Up Instructions: ?  ?I discussed the assessment and treatment plan with the patient. The patient was provided an opportunity to ask questions and all were answered. The patient agreed with the plan and demonstrated an understanding of the instructions. ?  ?The patient was advised to call back or seek an in-person evaluation if the symptoms worsen or if the condition fails to improve as anticipated. ? ?I provided 24 minutes of non-face-to-face time during this encounter. ? ?Meta HatchetUchenna E Devony Mcgrady, PA ? ? ?01/28/2022 4:39 PM ?Richard Bullahristopher H Merrow  ?MRN:  [id number] ? ?Chief Complaint: No chief complaint on file. ? ?HPI:  ? ?Richard Bullahristopher H. Neiswender is a 31 year old male with a past psychiatric history significant for major depressive disorder, PTSD, generalized anxiety disorder, and insomnia who [facility] via virtual video visit for follow-up and medication management.  Patient is currently being managed on the following medications: ? ?Abilify 5 mg daily ?Sertraline 50 mg daily ?Trazodone 100 mg at bedtime ? ?Patient reports that he has not been doing too well.  Patient is currently living with his ex-girlfriend and states that he has the end of June to find his own place.  Patient reports that his car has also broken down and he is still not working after sustaining injuries from the job.  Patient reports that he is under a lot of stress and believes that his medications are not effective in  managing his symptoms due to the sheer amount of stress even.  Patient states that he has some semblance of a plan with all the stressors he has.  He plans on getting his car fixed after taking out a loan.  Patient states that he is also saving money.  As far as his living situation goes, patient has no one to stay with at this time. ? ?Patient endorses extreme depression as well as anxiety.  Patient states that he has suicidal thoughts but is not a danger to himself.  When he has these thoughts, patient states that they are just thoughts and he has no plan in place or have any idea of going through with them.  Patient endorses anxiety and rates his anxiety at 9 out of 10.  Patient states that he had a panic attack a few days ago and states that he has had more panic attacks in the last 3 weeks been in 2 years.  A PHQ-9 screen was performed with the patient scoring a 23.  A GAD-7 screen was also performed with the patient scoring an 18. ? ?Patient is alert and oriented x4, calm, cooperative, and fully engaged in conversation during the encounter.  Patient reports that he feels horrible, scared, and depressed.  Patient endorses suicidal thoughts but denies having a plan in place.  Patient denies homicidal ideations.  Patient endorses auditory hallucinations stating that he hears random things such as doors opening or his name being called and his mother's voice.  Patient denies visual hallucinations and does not appear to be responding to internal/external stimuli.  Patient endorses fluctuating sleep and receives anywhere between 12 hours of sleep to half an hour of sleep during the night.  Patient endorses decreased appetite stating that he eats once every 48 hours.  Patient denies alcohol consumption.  Patient endorses using nicotine products and engaging in vaping.  Patient denies illicit drug use and states that he has stopped taking delta 8 products. ? ?Visit Diagnosis:  ?  ICD-10-CM   ?1. Current moderate  episode of major depressive disorder, unspecified whether recurrent (HCC)  F32.1 sertraline (ZOLOFT) 100 MG tablet  ?  ARIPiprazole (ABILIFY) 10 MG tablet  ?  ?2. PTSD (post-traumatic stress disorder)  F43.10 sertraline (ZOLOFT) 100 MG tablet  ?  ?3. GAD (generalized anxiety disorder)  F41.1 sertraline (ZOLOFT) 100 MG tablet  ?  ?4. Insomnia, unspecified type  G47.00 traZODone (DESYREL) 100 MG tablet  ?  ? ? ?Past Psychiatric History:  ?Borderline personality disorder ?Generalized anxiety disorder ?PTSD ?Major depressive disorder ?Insomnia ? ?Past Medical History:  ?Past Medical History:  ?Diagnosis Date  ? Asthma   ? as a child  ? Depression   ? Suicide (HCC)   ?  ?Past Surgical History:  ?Procedure Laterality Date  ? CHOLECYSTECTOMY    ? LEG SURGERY Right   ? ? ?Family Psychiatric History:  ?Grandmother (patient refers to his grandmother as his mother since she is the one that raised him a majority of his life). Patient reports that his mother was diagnosed with bipolar disorder and schizophrenia. ?Mother (Biological/birth mother) - Bipolar disorder ?Aunt -  Bipolar/schizophrenia ?Father's side -  depression and anxiety ?  ?Patient reports that alcoholism also runs in his family ? ?Family History:  ?Family History  ?Problem Relation Age of Onset  ? Diabetes Mother   ? Heart failure Mother   ? Hypertension Mother   ? Stroke Mother   ? ? ?Social History:  ?Social History  ? ?Socioeconomic History  ? Marital status: Single  ?  Spouse name: Not on file  ? Number of children: Not on file  ? Years of education: Not on file  ? Highest education level: Not on file  ?Occupational History  ? Not on file  ?Tobacco Use  ? Smoking status: Former  ?  Types: E-cigarettes  ? Smokeless tobacco: Former  ?  Types: Chew  ?Vaping Use  ? Vaping Use: Every day  ? Substances: Nicotine, Flavoring  ?Substance and Sexual Activity  ? Alcohol use: Yes  ?  Comment: socially  ? 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 Sexual  activity: Yes  ?Other Topics Concern  ? Not on file  ?Social History Narrative  ? Not on file  ? ?Social Determinants of Health  ? ?Financial Resource Strain: High Risk  ? Difficulty of Paying Living Expenses: Hard  ?Food Insecurity: Food Insecurity Present  ? Worried About Programme researcher, broadcasting/film/video in the Last Year: Sometimes true  ? Ran Out of Food in the Last Year: Sometimes true  ?Transportation Needs: No Transportation Needs  ? Lack of Transportation (Medical): No  ? Lack of Transportation (Non-Medical): No  ?Physical Activity: Insufficiently Active  ? Days of Exercise per Week: 1 day  ? Minutes of Exercise per Session: 60 min  ?Stress: Stress Concern Present  ? Feeling of Stress : Very much  ?Social Connections: Socially Isolated  ? Frequency of Communication with Friends and Family: Never  ? Frequency of Social Gatherings with Friends and Family: Once  a week  ? Attends Religious Services: Never  ? Active Member of Clubs or Organizations: No  ? Attends Banker Meetings: Never  ? Marital Status: Living with partner  ? ? ?Allergies:  ?Allergies  ?Allergen Reactions  ? Betadine [Povidone Iodine] Other (See Comments)  ?  Had surgery and incision got infected whiling healing with cast on   ? ? ?Metabolic Disorder Labs: ?No results found for: HGBA1C, MPG ?No results found for: PROLACTIN ?No results found for: CHOL, TRIG, HDL, CHOLHDL, VLDL, LDLCALC ?No results found for: TSH ? ?Therapeutic Level Labs: ?No results found for: LITHIUM ?No results found for: VALPROATE ?No components found for:  CBMZ ? ?Current Medications: ?Current Outpatient Medications  ?Medication Sig Dispense Refill  ? albuterol (PROVENTIL HFA;VENTOLIN HFA) 108 (90 Base) MCG/ACT inhaler Inhale 1-2 puffs into the lungs every 6 (six) hours as needed for wheezing or shortness of breath. 1 Inhaler 0  ? ARIPiprazole (ABILIFY) 10 MG tablet Take 1 tablet (10 mg total) by mouth daily. 30 tablet 2  ? azithromycin (ZITHROMAX) 250 MG tablet Take 1  tablet (250 mg total) by mouth daily. Take first 2 tablets together, then 1 every day until finished. 6 tablet 0  ? calcium carbonate (TUMS - DOSED IN MG ELEMENTAL CALCIUM) 500 MG chewable tablet Chew 1-2 tablets by

## 2022-02-12 ENCOUNTER — Ambulatory Visit (INDEPENDENT_AMBULATORY_CARE_PROVIDER_SITE_OTHER): Payer: No Payment, Other | Admitting: Licensed Clinical Social Worker

## 2022-02-12 DIAGNOSIS — F4310 Post-traumatic stress disorder, unspecified: Secondary | ICD-10-CM

## 2022-02-12 DIAGNOSIS — F331 Major depressive disorder, recurrent, moderate: Secondary | ICD-10-CM

## 2022-02-12 DIAGNOSIS — F411 Generalized anxiety disorder: Secondary | ICD-10-CM

## 2022-02-12 NOTE — Progress Notes (Signed)
? ?THERAPIST PROGRESS NOTE ? ?Virtual Visit via Video Note ? ?I connected with Richard Roberson on 02/12/22 at 11:00 AM EDT by a video enabled telemedicine application and verified that I am speaking with the correct person using two identifiers. ? ?Location: ?Patient: [facility]  ?Provider: Providers Home ?  ?I discussed the limitations of evaluation and management by telemedicine and the availability of in person appointments. The patient expressed understanding and agreed to proceed. ? ? ? ?  ?I discussed the assessment and treatment plan with the patient. The patient was provided an opportunity to ask questions and all were answered. The patient agreed with the plan and demonstrated an understanding of the instructions. ?  ?The patient was advised to call back or seek an in-person evaluation if the symptoms worsen or if the condition fails to improve as anticipated. ? ?I provided 40 minutes of non-face-to-face time during this encounter. ? ? ?[name], LCSW  ? ?Participation Level: Active ? ?Behavioral Response: CasualAlertAnxious, Depressed, Hopeless, and Worthless ? ?Type of Therapy: Individual Therapy ? ?Treatment Goals addressed: Decrease PHQ-9 below 10 and Decrease GAD-7 below 5  ? ?ProgressTowards Goals: Progressing ? ?Interventions: CBT, Motivational Interviewing, and Supportive ? ?Summary: Richard Roberson is a 31 y.o. male who presents with depressed and anxious mood\affect.  Patient was pleasant, cooperative, maintained good eye contact.  He engaged well in therapy session was dressed casually.  Richard Roberson was alert and oriented x5.  ? Patient reports primary stressors as illness, work, Education officer, community, housing, and relationship.  Patient reports that his girlfriend broke up with him 6 weeks ago after she found out that he was talking to other girls online.  Patient reports that he was in a polyamorous relationship and did not feel he was doing anything wrong but after review states  that there were definitely things that he could have done better.  Patient reports that he has another 4 to 6 weeks to move out of the house.  Patient reports that he does not have transportation at this time as his car needs maintenance of over $1500.  Patient reports that the mechanic has not gotten back to him on financing options and his next available option will be to get a bank loan.  Patient reports that he continues to do physical therapy for his foot fracture, however he is still having pain when he walks.  Goal for patient is to return to work by May 22.  Patient reports that this will relieve a lot of his stress if he can do it pain-free as he will double his income from receiving disability from his work. ? ? ?Suicidal/Homicidal: Nowithout intent/plan ? ?Therapist Response:  ? ? Intervention/Plan: Interventions utilized by this LCSW was administered to GAD-7.  LCSW administered a PHQ-9.  LCSW utilized CBT therapy to educate patient on PHQ-9 and GAD-7 scores.  Patient decreased PHQ-9 score by one-point from it last being taken 2 weeks ago.  LCSW notes that patient decrease his GAD-7 from an 18 to a 16 when it was last taken 2 weeks ago.  LCSW utilized motivational interviewing for positive affirmations, reflective listening, and open-ended questions.  LCSW utilized language for supportive therapy as praise and encouragement.  Plan for patient is to continue to decrease PHQ-9 and GAD-7.   ? ?Plan: Return again in 1 weeks. ? ?Diagnosis: Moderate episode of recurrent major depressive disorder (HCC) ? ?GAD (generalized anxiety disorder) ? ?PTSD (post-traumatic stress disorder) ? ?Collaboration of Care: Other none in  todays session  ? ?Patient/Guardian was advised Release of Information must be obtained prior to any record release in order to collaborate their care with an outside provider. Patient/Guardian was advised if they have not already done so to contact the registration department to sign all  necessary forms in order for [place] to release information regarding their care.  ? ?Consent: Patient/Guardian gives verbal consent for treatment and assignment of benefits for services provided during this visit. Patient/Guardian expressed understanding and agreed to proceed.  ? ?[name], LCSW ?02/12/2022 ? ?

## 2022-02-18 ENCOUNTER — Ambulatory Visit (INDEPENDENT_AMBULATORY_CARE_PROVIDER_SITE_OTHER): Payer: No Payment, Other | Admitting: Licensed Clinical Social Worker

## 2022-02-18 DIAGNOSIS — F411 Generalized anxiety disorder: Secondary | ICD-10-CM

## 2022-02-18 DIAGNOSIS — F321 Major depressive disorder, single episode, moderate: Secondary | ICD-10-CM

## 2022-02-18 NOTE — Progress Notes (Signed)
? ?  THERAPIST PROGRESS NOTE ? ?Virtual Visit via Video Note ? ?I connected with Richard Roberson on 02/18/22 at  4:00 PM EDT by a video enabled telemedicine application and verified that I am speaking with the correct person using two identifiers. ? ?Location: ?Patient: Richard Roberson  ?Provider: Providers Home  ?  ?I discussed the limitations of evaluation and management by telemedicine and the availability of in person appointments. The patient expressed understanding and agreed to proceed. ? ?  ?I discussed the assessment and treatment plan with the patient. The patient was provided an opportunity to ask questions and all were answered. The patient agreed with the plan and demonstrated an understanding of the instructions. ?  ?The patient was advised to call back or seek an in-person evaluation if the symptoms worsen or if the condition fails to improve as anticipated. ? ?I provided 40 minutes of non-face-to-face time during this encounter. ? ? ?Richard Cooks, LCSW  ? ?Participation Level: Active ? ?Behavioral Response: CasualAlertAnxious and Depressed ? ?Type of Therapy: Individual Therapy ? ?Treatment Goals addressed: identify three triggers causing anxiety  ? ?ProgressTowards Goals: Progressing ? ?Interventions: CBT ? ?Summary: Richard Roberson is a 31 y.o. male who presents with  ? ?Suicidal/Homicidal: Nowithout intent/plan ? ?Therapist Response:  ? ?Pt was alert and oriented x 5. He was dressed casually and engaged well in therapy session. Pt presented with depressed and anxious mood/affect. He was pleasant, cooperative, and maintained good eye contact.  ? Pt reports primary stressors for financials, housing, and work. Pt reports that he is finding out he has very little to no informal support such as friends and family to help him financially or with housing. Pt reports that he is supposed to go to the MD on Monday to get cleared to go back to work as he is currently on short term disability. Pt  reports that this has been stressful as he can have 0 restriction in order to go back to work. He has 6 weeks to move out of his home as his partner broke up with him. Richard Roberson states that he is out of transportation until he can pay the 1500.00 to get it fixed and knows he cannot start saving until he finds work.  ? Intervention/plan: LCSW used supportive therapy for praise and encouragement. LCSW used motivational interviewing for open ended questions, reflective listening, and positive affirmations. LCSW educated pt on partialization and prioritization.  ? ?Plan: Return again in 4 weeks. ? ?Diagnosis: Current moderate episode of major depressive disorder without prior episode (HCC) ? ?GAD (generalized anxiety disorder) ? ?Collaboration of Care: Other none in today's session  ? ?Patient/Guardian was advised Release of Information must be obtained prior to any record release in order to collaborate their care with an outside provider. Patient/Guardian was advised if they have not already done so to contact the registration department to sign all necessary forms in order for [place] to release information regarding their care.  ? ?Consent: Patient/Guardian gives verbal consent for treatment and assignment of benefits for services provided during this visit. Patient/Guardian expressed understanding and agreed to proceed.  ? ?Richard Cooks, LCSW ?02/18/2022 ? ?

## 2022-02-18 NOTE — Plan of Care (Signed)
?  Problem: Anxiety Disorder CCP Problem  1 GAD ?Goal: Identify three trigger causing anxiety  ?Outcome: Progressing ?Goal: STG: Patient will attend at least 80% of scheduled group psychotherapy sessions ?Outcome: Progressing ?Goal: STG: Patient will practice problem solving skills 3 times per week for the next 4 weeks ?Outcome: Progressing ?  ?

## 2022-03-04 ENCOUNTER — Ambulatory Visit (INDEPENDENT_AMBULATORY_CARE_PROVIDER_SITE_OTHER): Payer: No Payment, Other | Admitting: Licensed Clinical Social Worker

## 2022-03-04 DIAGNOSIS — F321 Major depressive disorder, single episode, moderate: Secondary | ICD-10-CM

## 2022-03-04 DIAGNOSIS — F4310 Post-traumatic stress disorder, unspecified: Secondary | ICD-10-CM

## 2022-03-04 DIAGNOSIS — F411 Generalized anxiety disorder: Secondary | ICD-10-CM

## 2022-03-04 NOTE — Plan of Care (Signed)
  Problem: Anxiety Disorder CCP Problem  1 GAD Goal:  walk 3 x weekly Outcome: Progressing Goal: Identify three trigger causing anxiety  Outcome: Progressing Goal: STG: Patient will attend at least 80% of scheduled group psychotherapy sessions Outcome: Progressing   Problem: Depression CCP Problem  1 MDD Goal: identify three trigger causing depression.  Outcome: Progressing

## 2022-03-04 NOTE — Progress Notes (Signed)
THERAPIST PROGRESS NOTE  Virtual Visit via Video Note  I connected with SHIHEEM CORPORAN on 03/04/22 at  3:00 PM EDT by a video enabled telemedicine application and verified that I am speaking with the correct person using two identifiers.  Location: Patient: [facility]  Provider: [facility]    I discussed the limitations of evaluation and management by telemedicine and the availability of in person appointments. The patient expressed understanding and agreed to proceed.     I discussed the assessment and treatment plan with the patient. The patient was provided an opportunity to ask questions and all were answered. The patient agreed with the plan and demonstrated an understanding of the instructions.   The patient was advised to call back or seek an in-person evaluation if the symptoms worsen or if the condition fails to improve as anticipated.  I provided 40 minutes of non-face-to-face time during this encounter.   [name], LCSW   Participation Level: Active  Behavioral Response: CasualAlertAnxious and Depressed  Type of Therapy: Individual Therapy  Treatment Goals addressed: identify three trigger causing anxiety   ProgressTowards Goals: Progressing  Interventions: CBT and Motivational Interviewing  Summary: AVI KERSCHNER is a 31 y.o. male who presents with depressed and anxious mood\affect.  Patient was pleasant, cooperative, maintained good eye contact.  He engaged well in therapy session was dressed casually.  Patient was alert and oriented x5.  Patient comes in today as primary stressors as ex relationship, financials, housing, work, and illness.  Patient has been dealing with a foot injury for the past 60 to 90 days and has been on Workmen's Comp.  Patient reports that he has been put back on light duty starting on Monday, May 22nd.  Patient reports that this is a good and bad thing.  Patient reports since being taken off the pain  medication they have started to notice some irregularities in his condition.  Patient reports that they are still diagnosing the situation and following it closely.  Patient reports that he is spending about $240 on [employer] back and forth to work every single day due to his car eating a $2000 repair.  Patient reports that he is drinking and about $800 weekly through his job and needs to save about $250-$300 per paycheck in order to get his car repaired.  Patient reports that he is working on bus routes that he could possibly take which would cut his transportation cost of $250 in half.  Patient reports that he is still looking for housing as his ex girlfriend is kicking him out of the house at the end of June.  Hiren reports a lack of self-worth due to conditioning that he has received through relationships growing up and previous romantic relationships.  Suicidal/Homicidal: Nowithout intent/plan  Therapist Response:    Intervention/Plan: LCSW utilized CBT therapy for cognitive restructuring utilizing positive affirmations in today's session.  LCSW utilized positive, calming, and empowering language for person centered therapy.  LCSW used supportive therapy for praise and encouragement.  LCSW spoke with patient about reconditioning thought process from negative thoughts or poor value to positive thoughts.  Plan for patient is to utilize positive affirmations 2 times daily.  Plan: Return again in 4 weeks.  Diagnosis: Current moderate episode of major depressive disorder without prior episode (HCC)  GAD (generalized anxiety disorder)  PTSD (post-traumatic stress disorder)  Collaboration of Care: Other None today   Patient/Guardian was advised Release of Information must be obtained prior to  any record release in order to collaborate their care with an outside provider. Patient/Guardian was advised if they have not already done so to contact the registration department to sign all necessary forms  in order for [place] to release information regarding their care.   Consent: Patient/Guardian gives verbal consent for treatment and assignment of benefits for services provided during this visit. Patient/Guardian expressed understanding and agreed to proceed.   [name], LCSW 03/04/2022

## 2022-04-01 ENCOUNTER — Telehealth (HOSPITAL_COMMUNITY): Payer: No Payment, Other | Admitting: Physician Assistant

## 2022-04-15 ENCOUNTER — Ambulatory Visit (INDEPENDENT_AMBULATORY_CARE_PROVIDER_SITE_OTHER): Payer: No Payment, Other | Admitting: Licensed Clinical Social Worker

## 2022-04-15 DIAGNOSIS — F331 Major depressive disorder, recurrent, moderate: Secondary | ICD-10-CM

## 2022-04-15 DIAGNOSIS — F411 Generalized anxiety disorder: Secondary | ICD-10-CM

## 2022-04-15 NOTE — Progress Notes (Signed)
   THERAPIST PROGRESS NOTE  Virtual Visit via Video Note  I connected with Richard Roberson on 04/15/22 at  4:00 PM EDT by a video enabled telemedicine application and verified that I am speaking with the correct person using two identifiers.  Location: Patient: [facility]  Provider: Provider Home    I discussed the limitations of evaluation and management by telemedicine and the availability of in person appointments. The patient expressed understanding and agreed to proceed.    I discussed the assessment and treatment plan with the patient. The patient was provided an opportunity to ask questions and all were answered. The patient agreed with the plan and demonstrated an understanding of the instructions.   The patient was advised to call back or seek an in-person evaluation if the symptoms worsen or if the condition fails to improve as anticipated.  I provided 40 minutes of non-face-to-face time during this encounter.   [name], LCSW   Participation Level: Active  Behavioral Response: CasualAlertAnxious and Depressed  Type of Therapy: Individual Therapy  Treatment Goals addressed: Patient will score less than 5 on the Generalized Anxiety Disorder 7 Scale  (GAD-7)  ProgressTowards Goals: Progressing  Interventions: CBT and Motivational Interviewing   Suicidal/Homicidal: Nowithout intent/plan  Therapist Response:    Pt was alert and oriented x 5. He was dressed casually and engaged well in therapy session. Pt presented with depressed and anxious mood/affect. He was pleasant, cooperative, and maintained good eye contact.   Pt reports primary stressor as work. Pt reports a decrease in depressive and anxiety symptoms. Pt attributes this to moving out of his ex-girlfriend's house. He has found an apartment with three other roommates. Pt reports he also has fixed his transportation. 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   Intervention/Plan : LCSW administered GAD-7. LCSW administered a PHQ-9. LCSW Used supportive therapy for praise and encouragement. LCSW used empowerment for person Centered therapy. LCSW used CBT for cognitive restructuring.   Plan: Return again in 4 weeks.  Diagnosis: Moderate episode of recurrent major depressive disorder (HCC)  GAD (generalized anxiety disorder)  Collaboration of Care: Other None today   Patient/Guardian was advised Release of Information must be obtained prior to any record release in order to collaborate their care with an outside provider. Patient/Guardian was advised if they have not already done so to contact the registration department to sign all necessary forms in order for [place] to release information regarding their care.   Consent: Patient/Guardian gives verbal consent for treatment and assignment of benefits for services provided during this visit. Patient/Guardian expressed understanding and agreed to proceed.   [name], LCSW 04/15/2022

## 2022-04-15 NOTE — Plan of Care (Signed)
  Problem: Anxiety Disorder CCP Problem  1 GAD Goal: Identify three trigger causing anxiety  Outcome: Progressing Goal: LTG: Patient will score less than 5 on the Generalized Anxiety Disorder 7 Scale (GAD-7) Outcome: Progressing Goal: STG: Patient will attend at least 80% of scheduled group psychotherapy sessions Outcome: Progressing Goal: STG: Patient will practice problem solving skills 3 times per week for the next 4 weeks Outcome: Progressing   Problem: Depression CCP Problem  1 MDD Goal: LTG: Richard Roberson WILL SCORE LESS THAN 10 ON THE PATIENT HEALTH QUESTIONNAIRE (PHQ-9) Outcome: Progressing   Problem: Anxiety Disorder CCP Problem  1 GAD Goal:  walk 3 x weekly Outcome: Not Progressing   Problem: Anxiety Disorder CCP Problem  1 GAD Intervention: Review results of GAD-7 with the patient to track progress

## 2022-04-30 ENCOUNTER — Telehealth (HOSPITAL_COMMUNITY): Payer: No Payment, Other | Admitting: Physician Assistant

## 2022-05-05 ENCOUNTER — Encounter (HOSPITAL_COMMUNITY): Payer: Self-pay | Admitting: Student in an Organized Health Care Education/Training Program

## 2022-05-05 ENCOUNTER — Telehealth (INDEPENDENT_AMBULATORY_CARE_PROVIDER_SITE_OTHER): Payer: No Payment, Other | Admitting: Student in an Organized Health Care Education/Training Program

## 2022-05-05 DIAGNOSIS — F321 Major depressive disorder, single episode, moderate: Secondary | ICD-10-CM

## 2022-05-05 DIAGNOSIS — F4310 Post-traumatic stress disorder, unspecified: Secondary | ICD-10-CM | POA: Diagnosis not present

## 2022-05-05 DIAGNOSIS — G4700 Insomnia, unspecified: Secondary | ICD-10-CM

## 2022-05-05 DIAGNOSIS — F411 Generalized anxiety disorder: Secondary | ICD-10-CM

## 2022-05-05 MED ORDER — TRAZODONE HCL 100 MG PO TABS: 100.0000 mg | ORAL_TABLET | Freq: Every day | ORAL | 2 refills | Status: DC

## 2022-05-05 MED ORDER — SERTRALINE HCL 100 MG PO TABS: 100.0000 mg | ORAL_TABLET | Freq: Every day | ORAL | 2 refills | Status: DC

## 2022-05-05 MED ORDER — ARIPIPRAZOLE 15 MG PO TABS: 15.0000 mg | ORAL_TABLET | Freq: Every day | ORAL | 2 refills | Status: DC

## 2022-05-05 NOTE — Progress Notes (Signed)
BH MD/PA/NP OP Progress Note  05/05/2022 2:59 PM Richard Roberson  MRN:  [id number]  Chief Complaint: No chief complaint on file.  HPI:   Virtual Visit via Video Note  I connected with Richard Roberson on 05/05/22 at 10:30 AM EDT by a video enabled telemedicine application and verified that I am speaking with the correct person using two identifiers.  Location: Patient: Home Provider: Office   I discussed the limitations of evaluation and management by telemedicine and the availability of in person appointments. The patient expressed understanding and agreed to proceed.  History of Present Illness:  Richard Roberson is a 31 year old male with a past psychiatric history significant for major depressive disorder, PTSD, generalized anxiety disorder, and insomnia who presents to [facility] via virtual video visit for follow-up and medication management.  Patient is currently being managed on the following medications:  Abilify 10 mg daily Sertraline 100 mg daily Trazodone 100 mg at bedtime  On assessment today patient reports that he recently came out as transgender.  Patient's pronouns are not yet clear however patient reports that they would like to go by the needing "Richard Roberson."  Patient reports that because today is his birthday he does have plans to hang out with other friends "and live a normal life."  Patient reports that since his last visit he and his fiance of 6 years broke up and he moved out of the home.  Patient reports that initially this was very difficult for him however he reports that most recently he has been "the happiest had been since high school."  Patient reports that he feels as though his medications are working fairly well and is not particularly anxious.  Patient reports that there is some anxiety related to his newly discovered transgender identity in his workplace.  Patient reports that his current work place has  asked that he continue to go by his birth name and dressed in a more masculine manner hoping that this will decrease risk for patient being ostracized at his job.  Patient reports that although he appreciates that insight this does not can feel uncomfortable.  Patient reports he is looking for a new job and has an interview later today.  Patient reports that as he has gotten adjusted being outside of his old relationship he is feeling more comfortable with himself and is attempting to lose weight.  Patient reports that he still does not like his binge type pattern with eating but he is noticing some weight loss.  Patient reports that he sleeps well when taking trazodone and also feels that his environment is now more peaceful.  Patient reports that he continues to follow with therapy.  Patient denies any recent SI, HI or VH.  Patient endorses that he does continue to have occasional AH reporting that he feels like he hears people calling his name off in the distance behind him.  Objectively, patient is noted to be vaping throughout assessment.  Patient did endorse that he is not interested in stopping vaping.    I discussed the assessment and treatment plan with the patient. The patient was provided an opportunity to ask questions and all were answered. The patient agreed with the plan and demonstrated an understanding of the instructions.   The patient was advised to call back or seek an in-person evaluation if the symptoms worsen or if the condition fails to improve as anticipated.  I provided 30 minutes of non-face-to-face time during this  encounter.  PGY-3 Bobbye Morton, MD  Visit Diagnosis:    ICD-10-CM   1. Current moderate episode of major depressive disorder, unspecified whether recurrent (HCC)  F32.1 ARIPiprazole (ABILIFY) 15 MG tablet    sertraline (ZOLOFT) 100 MG tablet    2. PTSD (post-traumatic stress disorder)  F43.10 sertraline (ZOLOFT) 100 MG tablet    3. GAD (generalized anxiety  disorder)  F41.1 sertraline (ZOLOFT) 100 MG tablet    4. Insomnia, unspecified type  G47.00 traZODone (DESYREL) 100 MG tablet      Past Psychiatric History: Borderline personality disorder Generalized anxiety disorder PTSD Major depressive disorder Insomnia  Past Medical History:  Past Medical History:  Diagnosis Date   Asthma    as a child   Depression    Suicide (HCC)     Past Surgical History:  Procedure Laterality Date   CHOLECYSTECTOMY     LEG SURGERY Right     Family Psychiatric History: Per message and there I do note and started acting responsive messages of putting noted okay  Family History:  Family History  Problem Relation Age of Onset   Diabetes Mother    Heart failure Mother    Hypertension Mother    Stroke Mother     Social History:  Social History   Socioeconomic History   Marital status: Single    Spouse name: Not on file   Number of children: Not on file   Years of education: Not on file   Highest education level: Not on file  Occupational History   Not on file  Tobacco Use   Smoking status: Former    Types: E-cigarettes   Smokeless tobacco: Former    Types: Associate Professor Use: Every day   Substances: Nicotine, Flavoring  Substance and Sexual Activity   Alcohol use: Yes    Comment: socially   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   Sexual activity: Yes  Other Topics Concern   Not on file  Social History Narrative   Not on file   Social Determinants of Health   Financial Resource Strain: High Risk (03/20/2021)   Overall Financial Resource Strain (CARDIA)    Difficulty of Paying Living Expenses: Hard  Food Insecurity: Food Insecurity Present (03/20/2021)   Hunger Vital Sign    Worried About Running Out of Food in the Last Year: Sometimes true    Ran Out of Food in the Last Year: Sometimes true  Transportation Needs: No Transportation Needs (03/20/2021)   PRAPARE - Scientist, research (physical sciences) (Medical): No    Lack of Transportation (Non-Medical): No  Physical Activity: Insufficiently Active (03/20/2021)   Exercise Vital Sign    Days of Exercise per Week: 1 day    Minutes of Exercise per Session: 60 min  Stress: Stress Concern Present (03/20/2021)   [employer] of Occupational Health - Occupational Stress Questionnaire    Feeling of Stress : Very much  Social Connections: Socially Isolated (03/20/2021)   Social Connection and Isolation Panel [NHANES]    Frequency of Communication with Friends and Family: Never    Frequency of Social Gatherings with Friends and Family: Once a week    Attends Religious Services: Never    Database administrator or Organizations: No    Attends Banker Meetings: Never    Marital Status: Living with partner    Allergies:  Allergies  Allergen Reactions   Betadine [Povidone  Iodine] Other (See Comments)    Had surgery and incision got infected whiling healing with cast on     Metabolic Disorder Labs: No results found for: "HGBA1C", "MPG" No results found for: "PROLACTIN" No results found for: "CHOL", "TRIG", "HDL", "CHOLHDL", "VLDL", "LDLCALC" No results found for: "TSH"  Therapeutic Level Labs: No results found for: "LITHIUM" No results found for: "VALPROATE" No results found for: "CBMZ"  Current Medications: Current Outpatient Medications  Medication Sig Dispense Refill   albuterol (PROVENTIL HFA;VENTOLIN HFA) 108 (90 Base) MCG/ACT inhaler Inhale 1-2 puffs into the lungs every 6 (six) hours as needed for wheezing or shortness of breath. 1 Inhaler 0   ARIPiprazole (ABILIFY) 15 MG tablet Take 1 tablet (15 mg total) by mouth daily. 30 tablet 2   azithromycin (ZITHROMAX) 250 MG tablet Take 1 tablet (250 mg total) by mouth daily. Take first 2 tablets together, then 1 every day until finished. 6 tablet 0   calcium carbonate (TUMS - DOSED IN MG ELEMENTAL CALCIUM) 500 MG chewable tablet Chew 1-2 tablets by mouth  as needed for indigestion or heartburn.     cyclobenzaprine (FLEXERIL) 10 MG tablet Take 1 tablet (10 mg total) by mouth 2 (two) times daily as needed for muscle spasms. 20 tablet 0   hydrOXYzine (ATARAX/VISTARIL) 25 MG tablet Take 1 tablet (25 mg total) by mouth 3 (three) times daily as needed for anxiety. 12 tablet 0   ibuprofen (ADVIL,MOTRIN) 200 MG tablet Take 800 mg by mouth every 6 (six) hours as needed for moderate pain.      lidocaine (LIDODERM) 5 % Place 1 patch onto the skin daily. Remove & Discard patch within 12 hours or as directed by MD 15 patch 0   methylPREDNISolone (MEDROL DOSEPAK) 4 MG TBPK tablet Please follow the instructions on the Dosepak to take the steroids. 21 each 0   sertraline (ZOLOFT) 100 MG tablet Take 1 tablet (100 mg total) by mouth daily. 30 tablet 2   traZODone (DESYREL) 100 MG tablet Take 1 tablet (100 mg total) by mouth at bedtime. 30 tablet 2   No current facility-administered medications for this visit.     Musculoskeletal: Defer  Psychiatric Specialty Exam: Review of Systems  Psychiatric/Behavioral:  Negative for behavioral problems, dysphoric mood, self-injury and suicidal ideas.     There were no vitals taken for this visit.There is no height or weight on file to calculate BMI.  General Appearance: Casual and femin andine  Eye Contact:  Fair  Speech:  Clear and Coherent  Volume:  Normal  Mood:  Euthymic  Affect:  Appropriate and Congruent  Thought Process:  Coherent  Orientation:  Full (Time, Place, and Person)  Thought Content: Logical   Suicidal Thoughts:  No  Homicidal Thoughts:  No  Memory:  Immediate;   Good Recent;   Good  Judgement:  Fair  Insight:  Fair  Psychomotor Activity:  Normal  Concentration:  Concentration: Good  Recall:  NA  Fund of Knowledge: Good  Language: Good  Akathisia:  No    AIMS (if indicated): not done  Assets:  Communication Skills Desire for Improvement Housing Resilience Social Support  ADL's:   Intact  Cognition: WNL  Sleep:  Fair   Screenings: AUDIT    Advertising copywriter from 03/20/2021 in [facility]  Alcohol Use Disorder Identification Test Final Score (AUDIT) 8      GAD-7    Flowsheet Row Counselor from 04/15/2022 in [facility]  Counselor from 02/12/2022 in [facility] Video Visit from 01/28/2022 in [facility] Counselor from 01/27/2022 in [facility] Counselor from 12/31/2021 in [facility]  Total GAD-7 Score 12 16 18 15 10          Flowsheet Row Counselor from 04/15/2022 in [facility] Counselor from 02/12/2022 in [facility] Video Visit from 01/28/2022 in [facility] Counselor from 01/27/2022 in [facility] Counselor from 12/31/2021 in [facility]  PHQ-2 Total Score 4 5 6 6 2   PHQ-9 Total Score 15 22 23 17 14       Flowsheet Row Counselor from 02/12/2022 in [facility] Video Visit from 01/28/2022 in [facility] Counselor from 01/27/2022 in [facility]  C-SSRS RISK CATEGORY Low Risk Low Risk Low Risk        Assessment and Plan: Richard Roberson appears to be overall stable in regards to mood on current regimen.  Patient does continue to endorse auditory hallucinations but feels some benefit from Abilify as Richard Roberson reports stable mood and decreased anxiety as well as decreased auditory hallucinations since being on this medication.  Patient would likely benefit from a small increase.  Patient continues to integrate into society as a transgender person.  MDD Insomnia Anxiety Borderline personality disorder Concern for gender dysphoria - Continue Zoloft 100 mg daily - Continue  trazodone 100 mg nightly - Increase Abilify from 10 mg daily to 15 mg daily   Collaboration of Care: Collaboration of Care:   Patient/Guardian was advised Release of Information must be obtained prior to any record release in order to collaborate their care with an outside provider. Patient/Guardian was advised if they have not already done so to contact the registration department to sign all necessary forms in order for [facility] to release information regarding their care.   Consent: Patient/Guardian gives verbal consent for treatment and assignment of benefits for services provided during this visit. Patient/Guardian expressed understanding and agreed to proceed.   PGY-3 01/29/2022, MD 05/05/2022, 2:59 PM

## 2022-05-06 ENCOUNTER — Ambulatory Visit (INDEPENDENT_AMBULATORY_CARE_PROVIDER_SITE_OTHER): Payer: No Payment, Other | Admitting: Licensed Clinical Social Worker

## 2022-05-06 DIAGNOSIS — F411 Generalized anxiety disorder: Secondary | ICD-10-CM | POA: Diagnosis not present

## 2022-05-06 DIAGNOSIS — F331 Major depressive disorder, recurrent, moderate: Secondary | ICD-10-CM

## 2022-05-06 NOTE — Progress Notes (Signed)
   THERAPIST PROGRESS NOTE  Virtual Visit via Video Note  I connected with Richard Roberson on 05/06/22 at  3:00 PM EDT by a video enabled telemedicine application and verified that I am speaking with the correct person using two identifiers.  Location: Patient: Richard Roberson Provider: Provider Home    I discussed the limitations of evaluation and management by telemedicine and the availability of in person appointments. The patient expressed understanding and agreed to proceed.      I discussed the assessment and treatment plan with the patient. The patient was provided an opportunity to ask questions and all were answered. The patient agreed with the plan and demonstrated an understanding of the instructions.   The patient was advised to call back or seek an in-person evaluation if the symptoms worsen or if the condition fails to improve as anticipated.  I provided 30 minutes of non-face-to-face time during this encounter.   [name], LCSW   Participation Level: Active  Behavioral Response: CasualAlertEuthymic  Type of Therapy: Individual Therapy  Treatment Goals addressed: Identify three trigger causing anxiety   ProgressTowards Goals: Progressing  Interventions: Motivational Interviewing, Strength-based, Supportive, and Reframing   Suicidal/Homicidal: Nowithout intent/plan  Therapist Response:     Pt was alert and oriented x 5. She/they was pleasant, cooperative, and maintained good eye contact. Pt engaged well in therapy session and was dressed casually. She/they presented with euthymic mood/affect.   Pt came out in today's session as a transgender male. Pt pronouns are she/they and her/they and pt preferred name is Richard Roberson. Pt reports "I Feel like a weight has been lifted off my shoulders." Pt reports there has been people that have no been supportive, however most people have been kind, pleasant, and supportive to pt. Richard Roberson states she/they have  gotten the best sleep she/they have ever gotten last night. Other stressor for pt is grief/loss it is her/they mother birthday tomorrow and pt know that increases depression. Pt reports getting a support system of friend to be around for the day and has taken the day off.   Intervention/Plan: LCSW used supportive therapy for praise and encouragement. LCSW used person centered therapy for empowerment. LCSW used unconditional positive regards maintain nonjudgmental stance throughout session. LCSW spoke with pt of the importance of taking medication. LCSW educated pt on the signs and symptoms of depression and anxiety.   Plan: Return again in 3 weeks.  Diagnosis: No diagnosis found.  Collaboration of Care: Other None today   Patient/Guardian was advised Release of Information must be obtained prior to any record release in order to collaborate their care with an outside provider. Patient/Guardian was advised if they have not already done so to contact the registration department to sign all necessary forms in order for [place] to release information regarding their care.   Consent: Patient/Guardian gives verbal consent for treatment and assignment of benefits for services provided during this visit. Patient/Guardian expressed understanding and agreed to proceed.   [name], LCSW 05/06/2022

## 2022-05-27 ENCOUNTER — Ambulatory Visit (INDEPENDENT_AMBULATORY_CARE_PROVIDER_SITE_OTHER): Payer: No Payment, Other | Admitting: Licensed Clinical Social Worker

## 2022-05-27 DIAGNOSIS — F640 Transsexualism: Secondary | ICD-10-CM | POA: Insufficient documentation

## 2022-05-27 DIAGNOSIS — F331 Major depressive disorder, recurrent, moderate: Secondary | ICD-10-CM | POA: Diagnosis not present

## 2022-05-27 DIAGNOSIS — F411 Generalized anxiety disorder: Secondary | ICD-10-CM

## 2022-05-27 NOTE — Progress Notes (Signed)
   THERAPIST PROGRESS NOTE  Virtual Visit via Video Note  I connected with Richard Roberson on 05/27/22 at  4:00 PM EDT by a video enabled telemedicine application and verified that I am speaking with the correct person using two identifiers.  Location: Patient: Richard Roberson  Provider: Providers Home    I discussed the limitations of evaluation and management by telemedicine and the availability of in person appointments. The patient expressed understanding and agreed to proceed.      I discussed the assessment and treatment plan with the patient. The patient was provided an opportunity to ask questions and all were answered. The patient agreed with the plan and demonstrated an understanding of the instructions.   The patient was advised to call back or seek an in-person evaluation if the symptoms worsen or if the condition fails to improve as anticipated.  I provided 30 minutes of non-face-to-face time during this encounter.   [name], LCSW   Participation Level: Active  Behavioral Response: CasualAlertAnxious and Depressed  Type of Therapy: Individual Therapy  Treatment Goals addressed: decrease PHQ-9 below 10    ProgressTowards Goals: Progressing  Interventions: Motivational Interviewing and Supportive    Suicidal/Homicidal: Nowithout intent/plan  Therapist Response:   Pt was alert and oriented x 5. She was dressed casually and engaged well in therapy session. Pt presented with depressed and anxious mood/affect. He was pleasant, cooperative, and maintained good eye contact.  Pt last session came out as transgender male and wanted to be called [name]. 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 She states that her main job at [employer] does not allow her to state her transition for her safety, although management reports support. 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 Pt reports that she has a job at [employer] but is not getting good pay or many hours. 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  Intervention/Plan: LCSW administer PHQ-9. LCSW administered a GAD-7. LCSW note decrease in PHQ-9 from 15 to 13. LCSW notes no changes in GAD-7. LCSW used supportive therapy for praise and encouragement. LCSW continue to educate pt on partializing problems into small more manageable pieces. LCSW used person centered therapy for empowerment.   Plan: Return again in 3 weeks.  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 Patient/Guardian was advised if they have not already done so to contact the registration department to sign all necessary forms in order for [place] to release information regarding their care.   Consent: Patient/Guardian gives verbal consent for treatment and assignment of benefits for services provided during this visit. Patient/Guardian expressed understanding and agreed to proceed.   [name], LCSW 05/27/2022

## 2022-06-16 ENCOUNTER — Ambulatory Visit (INDEPENDENT_AMBULATORY_CARE_PROVIDER_SITE_OTHER): Payer: No Payment, Other | Admitting: Licensed Clinical Social Worker

## 2022-06-16 DIAGNOSIS — F331 Major depressive disorder, recurrent, moderate: Secondary | ICD-10-CM

## 2022-06-16 DIAGNOSIS — F4310 Post-traumatic stress disorder, unspecified: Secondary | ICD-10-CM

## 2022-06-16 DIAGNOSIS — F411 Generalized anxiety disorder: Secondary | ICD-10-CM | POA: Diagnosis not present

## 2022-06-16 DIAGNOSIS — F640 Transsexualism: Secondary | ICD-10-CM

## 2022-06-16 NOTE — Progress Notes (Addendum)
   THERAPIST PROGRESS NOTE  Session Time: 30  Participation Level: Active  Behavioral Response: CasualAlertAnxious and Depressed  Type of Therapy: Individual Therapy  Treatment Goals addressed: Decrease GAD-7 below a 5  ProgressTowards Goals: Progressing  Interventions: CBT and Motivational Interviewing  Summary: Richard Roberson is a 31 y.o. male who presents with depressed and anxious mood\affect.  Patient was pleasant, cooperative, maintained good eye contact.  She engaged well in therapy session and was dressed casually.  LCSW notes she, her will be used for this patient as patient identifies as preferred pronouns.  Patient was scheduled for in person session and LCSW called him after patient was not checked in at 10:15 AM.  Patient did answer and stated that he "slept in".  LCSW switched from in person to virtual session for today.  LCSW reminded patient of late policy and no call no-shows.  Patient reports good news in today's session stating that he has a ""girlfriend".  Patient reports that he has been dating her for about 2 weeks.  Patient reports an overall decrease in anxiety but states that depression is still present due to social determinants of health for financials and work.  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  Patient reports that it is stressful at [employer] as he cannot identify as "[name]" as management has stated that this could cause discrimination with floor employees.  Neysa Bonito does report that management has been overall supportive of his transition from male to male.  Suicidal/Homicidal: Nowithout intent/plan  Therapist Response:     Intervention/Plan: LCSW administered the GAD-7.  LCSW administered the PHQ-9.  LCSW notes an increase in PHQ-9 x 4 points.  LCSW notes an decrease in GAD-7 x 5 points.  LCSW psychoanalytic therapy in today's session for patient to express  thoughts, feelings and emotions in today's session utilizing unconditional positive regard and nonjudgmental stances using person centered therapy.  LCSW educated patient on symptoms for depression, and anxiety.  LCSW educated patient on taking medications as prescribed.  Plan for patient is to continue to follow-up with this LCSW every 3 to 4 weeks.  Patient to continue to utilize coping skills for walks, spending time with friends, and engaging in D&D games.  Plan: Return again in 3 weeks.  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 Patient/Guardian was advised if they have not already done so to contact the registration department to sign all necessary forms in order for [place] to release information regarding their care.   Consent: Patient/Guardian gives verbal consent for treatment and assignment of benefits for services provided during this visit. Patient/Guardian expressed understanding and agreed to proceed.   [name], LCSW 06/16/2022

## 2022-07-08 ENCOUNTER — Ambulatory Visit (INDEPENDENT_AMBULATORY_CARE_PROVIDER_SITE_OTHER): Payer: No Payment, Other | Admitting: Licensed Clinical Social Worker

## 2022-07-08 DIAGNOSIS — F331 Major depressive disorder, recurrent, moderate: Secondary | ICD-10-CM

## 2022-07-08 DIAGNOSIS — F411 Generalized anxiety disorder: Secondary | ICD-10-CM

## 2022-07-08 NOTE — Progress Notes (Signed)
   THERAPIST PROGRESS NOTE  Virtual Visit via Video Note  I connected with Richard Roberson on 07/08/22 at  4:00 PM EDT by a video enabled telemedicine application and verified that I am speaking with the correct person using two identifiers.  Location: Patient: [facility]  Provider: Providers Home    I discussed the limitations of evaluation and management by telemedicine and the availability of in person appointments. The patient expressed understanding and agreed to proceed.      I discussed the assessment and treatment plan with the patient. The patient was provided an opportunity to ask questions and all were answered. The patient agreed with the plan and demonstrated an understanding of the instructions.   The patient was advised to call back or seek an in-person evaluation if the symptoms worsen or if the condition fails to improve as anticipated.  I provided 30 minutes of non-face-to-face time during this encounter.   Dory Horn, LCSW   Participation Level: Active  Behavioral Response: CasualAlertAnxious and Depressed  Type of Therapy: Individual Therapy  Treatment Goals addressed: Patient will score less than 5 on the Generalized Anxiety Disorder 7 Scale (GAD-7)  ProgressTowards Goals: Progressing  Interventions: CBT, Motivational Interviewing, and Supportive  Summary: Richard Roberson is a 31 y.o. male who presents with .   Suicidal/Homicidal: Nowithout intent/plan  Therapist Response:   Pt was alert and oriented x 5. They were dressed casually and engaged well in therapy session. Pt presented with depressed and anxious mood/affect. [name] was pleasant, cooperative, and maintained good eye contact.   Pt reports primary stressor as housing and illness. Pt reports that he again got hurt at his job at [employer], this time breaking his big toe. Pt reports that he does not get benefits for lost wages through Work men comp as he must be out for  more than 2 weeks. Pt states that he is struggling financially again. Pt also reports stress for housing as his roommates are now requesting a $600 security deposit along with rent for this month. Pt reports that he cannot afford until he has time to save money up and get back to work.   Interventions/Plan: LCSW administered a PHQ-9. LCSW administered a GAD-7. LCSW reviewed scores with pt. LCSW used supportive therapy for praise and encouragement. LCSW used psychoanalytic therapy for praise and encouragement. LCSW used reframing in session. LCSW used empowerment and unconditional positive regard for nonjudgmental stance   Plan: Return again in 3 weeks.  Diagnosis: Moderate episode of recurrent major depressive disorder (HCC)  GAD (generalized anxiety disorder)  Collaboration of Care: Other None reported today   Patient/Guardian was advised Release of Information must be obtained prior to any record release in order to collaborate their care with an outside provider. Patient/Guardian was advised if they have not already done so to contact the registration department to sign all necessary forms in order for [place] to release information regarding their care.   Consent: Patient/Guardian gives verbal consent for treatment and assignment of benefits for services provided during this visit. Patient/Guardian expressed understanding and agreed to proceed.   Dory Horn, LCSW 07/08/2022

## 2022-07-08 NOTE — Plan of Care (Signed)
  Problem: Anxiety Disorder CCP Problem  1 GAD Goal: Identify three trigger causing anxiety  Outcome: Progressing Goal: LTG: Patient will score less than 5 on the Generalized Anxiety Disorder 7 Scale (GAD-7) Outcome: Progressing Goal: STG: Patient will attend at least 80% of scheduled group psychotherapy sessions Outcome: Progressing Goal: STG: Patient will practice problem solving skills 3 times per week for the next 4 weeks Outcome: Progressing   Problem: Depression CCP Problem  1 MDD Goal: identify three trigger causing depression.  Outcome: Progressing Goal: LTG: Richard Roberson WILL SCORE LESS THAN 10 ON THE PATIENT HEALTH QUESTIONNAIRE (PHQ-9) Outcome: Progressing   Problem: Anxiety Disorder CCP Problem  1 GAD Goal:  walk 3 x weekly Outcome: Not Progressing

## 2022-08-02 ENCOUNTER — Telehealth (HOSPITAL_COMMUNITY): Payer: Self-pay | Admitting: Licensed Clinical Social Worker

## 2022-08-02 ENCOUNTER — Ambulatory Visit (HOSPITAL_COMMUNITY): Payer: No Payment, Other | Admitting: Licensed Clinical Social Worker

## 2022-08-02 NOTE — Telephone Encounter (Signed)
Spoke with pt today, pt reports he is on his way to the hospital as grandmother is "actively dying". LCSW will mark pt as left without being seen today.

## 2022-08-03 ENCOUNTER — Telehealth (HOSPITAL_COMMUNITY): Payer: No Payment, Other | Admitting: Student in an Organized Health Care Education/Training Program

## 2022-08-03 ENCOUNTER — Encounter (HOSPITAL_COMMUNITY): Payer: Self-pay

## 2022-08-04 ENCOUNTER — Ambulatory Visit (HOSPITAL_COMMUNITY): Payer: Self-pay | Admitting: Licensed Clinical Social Worker

## 2022-08-09 ENCOUNTER — Telehealth (HOSPITAL_COMMUNITY): Payer: No Payment, Other | Admitting: Student in an Organized Health Care Education/Training Program

## 2022-08-11 ENCOUNTER — Ambulatory Visit (INDEPENDENT_AMBULATORY_CARE_PROVIDER_SITE_OTHER): Payer: No Payment, Other | Admitting: Licensed Clinical Social Worker

## 2022-08-11 DIAGNOSIS — F4310 Post-traumatic stress disorder, unspecified: Secondary | ICD-10-CM

## 2022-08-11 DIAGNOSIS — F411 Generalized anxiety disorder: Secondary | ICD-10-CM

## 2022-08-11 DIAGNOSIS — F321 Major depressive disorder, single episode, moderate: Secondary | ICD-10-CM

## 2022-08-11 NOTE — Progress Notes (Signed)
THERAPIST PROGRESS NOTE  Virtual Visit via Video Note  I connected with CRIS TALAVERA on 08/11/22 at  1:00 PM EDT by a video enabled telemedicine application and verified that I am speaking with the correct person using two identifiers.  Location: Patient: [facility]  Provider: Provider Home    I discussed the limitations of evaluation and management by telemedicine and the availability of in person appointments. The patient expressed understanding and agreed to proceed.    I discussed the assessment and treatment plan with the patient. The patient was provided an opportunity to ask questions and all were answered. The patient agreed with the plan and demonstrated an understanding of the instructions.   The patient was advised to call back or seek an in-person evaluation if the symptoms worsen or if the condition fails to improve as anticipated.  I provided 30 minutes of non-face-to-face time during this encounter.   Dory Horn, LCSW   Participation Level: Active  Behavioral Response: CasualAlertAnxious and Depressed  Type of Therapy: Individual Therapy  Treatment Goals addressed: Patient will attend at least 80% of scheduled group psychotherapy  sessions  ProgressTowards Goals: Progressing  Interventions: CBT, Motivational Interviewing, and Supportive  Summary: BAY WAYSON is a 31 y.o. adult who presents with depressed and anxious mood\affect.  Patient was pleasant, cooperative, maintained good eye contact.  He engaged well in therapy session was dressed casually.  Patient comes in today for virtual session follow-up.  Patient endorses symptoms for worthlessness, hopelessness, irritability, tension, and worry.  Patient reports primary stressor is grief and loss.  Patient reports 1 week ago he lost his grandmother.  Patient reports that this was the closest member of his family other than his mom who had passed away several years ago.  Alyse Low reports  that there are several discrepancies in the family currently due to his grandmother's estate.  Other stressors for patient are work and finances.  Patient reports that he lost his job at [employer] due to missing too much time off of work and "work Systems analyst".  Alyse Low reports that he knows that it is because of his injuries and miss time off, however Kristopher Oppenheim would not put that on record.  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  Other stressors for patient are his ex relationship.  Patient reports having thoughts of his ex girlfriend and possibly wanting her back.  Patient states that "[name] does not want his ex-girlfriend back but Evann would give her a second chance".   Suicidal/Homicidal: Nowithout intent/plan  Therapist Response:     Intervention/Plan:   LCSW supportive therapy for praise and encouragement.  LCSW educated patient on the stages of grief and loss for depression, bargaining, acceptance, anger, and denial.  LCSW used CBT therapy for reframing and cognitive restructuring.  LCSW psychoanalytic therapy for patient to express thoughts, feelings, and emotions.  Plan for patient and next session will be to do a GAD-7.  Patient will also be administered a PHQ-9.  Patient to continue to process through grief and loss of his grandmother utilizing the stages of grief..  Plan: Return again in 3 weeks.  Diagnosis: Current moderate episode of major depressive disorder without prior episode ([place])  Collaboration of Care: Other None today  Patient/Guardian was advised Release of Information must be obtained prior to any record release in order to collaborate their care with an outside provider. Patient/Guardian was advised if they have not already done so to contact the  registration department to sign all necessary forms in order for [place] to release information regarding their care.   Consent: Patient/Guardian gives verbal consent for treatment and assignment of  benefits for services provided during this visit. Patient/Guardian expressed understanding and agreed to proceed.   [name], LCSW 08/11/2022

## 2022-08-12 ENCOUNTER — Ambulatory Visit (HOSPITAL_COMMUNITY): Payer: No Payment, Other | Admitting: Licensed Clinical Social Worker

## 2022-08-24 ENCOUNTER — Ambulatory Visit (HOSPITAL_COMMUNITY): Payer: No Payment, Other | Admitting: Licensed Clinical Social Worker

## 2022-09-15 ENCOUNTER — Ambulatory Visit (INDEPENDENT_AMBULATORY_CARE_PROVIDER_SITE_OTHER): Payer: No Payment, Other | Admitting: Licensed Clinical Social Worker

## 2022-09-15 DIAGNOSIS — F411 Generalized anxiety disorder: Secondary | ICD-10-CM

## 2022-09-15 DIAGNOSIS — F321 Major depressive disorder, single episode, moderate: Secondary | ICD-10-CM

## 2022-09-15 DIAGNOSIS — F4310 Post-traumatic stress disorder, unspecified: Secondary | ICD-10-CM

## 2022-09-15 NOTE — Progress Notes (Signed)
   THERAPIST PROGRESS NOTE  Virtual Visit via Video Note  I connected with Richard Roberson on 09/15/22 at  4:00 PM EST by a video enabled telemedicine application and verified that I am speaking with the correct person using two identifiers.  Location: Patient: [facility]  Provider: Provider Home    I discussed the limitations of evaluation and management by telemedicine and the availability of in person appointments. The patient expressed understanding and agreed to proceed.     I discussed the assessment and treatment plan with the patient. The patient was provided an opportunity to ask questions and all were answered. The patient agreed with the plan and demonstrated an understanding of the instructions.   The patient was advised to call back or seek an in-person evaluation if the symptoms worsen or if the condition fails to improve as anticipated.  I provided 30 minutes of non-face-to-face time during this encounter.   [name], LCSW   Participation Level: Active  Behavioral Response: CasualAlertAnxious and Depressed  Type of Therapy: Individual Therapy  Treatment Goals addressed: Identify three trigger causing anxiety    ProgressTowards Goals: Progressing  Interventions: Motivational Interviewing, Supportive, and Reframing  Summary: Richard Roberson is a 31 y.o. adult who presents with depressed and anxious mood\affect.  Patient was pleasant, cooperative, maintained good eye contact.  She engaged well in therapy session was dressed casually.  Richard Roberson was alert and oriented x 5   Patient reports primary stressors as housing, financials, and work.  Patient reports that he lost all staff to a housing situation where he was not able to pay the fees and penalties associated with utilities.  He eventually had to move out of the home but did not get his stuff.  Patient reports that he was in contact with his former roommates who blocked him on everything and  they are not returning his calls.  Patient reports that there is no paper trail that he was supposed to be living in the house and believes that all his stuff is now gone including his mother's ashes.  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  Patient reports that he has been applying for jobs daily on [employer] but has had no job offers.  Suicidal/Homicidal: Nowithout intent/plan  Therapist Response:     Intervention/Plan: LCSW administered a GAD-7 LCSW administered the PHQ-9.  LCSW reviewed scores with patient.  LCSW notes an increase in both GAD-7 and PHQ-9.  LCSW supportive therapy for praise and encouragement.  Plan for patient is to apply for at least 3 jobs daily.  Patient to follow-up with jobs that he applied for 3 to 5 days after applying.  Plan: Return again in 3 weeks.  Diagnosis: Current moderate episode of major depressive disorder, unspecified whether recurrent (HCC)  GAD (generalized anxiety disorder)  PTSD (post-traumatic stress disorder)  Collaboration of Care: Other None today   Patient/Guardian was advised Release of Information must be obtained prior to any record release in order to collaborate their care with an outside provider. Patient/Guardian was advised if they have not already done so to contact the registration department to sign all necessary forms in order for [place] to release information regarding their care.   Consent: Patient/Guardian gives verbal consent for treatment and assignment of benefits for services provided during this visit. Patient/Guardian expressed understanding and agreed to proceed.   [name], LCSW 09/15/2022

## 2022-09-20 ENCOUNTER — Ambulatory Visit (HOSPITAL_COMMUNITY): Payer: No Payment, Other | Admitting: Licensed Clinical Social Worker

## 2022-10-01 ENCOUNTER — Encounter (HOSPITAL_COMMUNITY): Payer: Self-pay | Admitting: Student in an Organized Health Care Education/Training Program

## 2022-10-01 ENCOUNTER — Telehealth (INDEPENDENT_AMBULATORY_CARE_PROVIDER_SITE_OTHER): Payer: No Payment, Other | Admitting: Student in an Organized Health Care Education/Training Program

## 2022-10-01 DIAGNOSIS — G4700 Insomnia, unspecified: Secondary | ICD-10-CM

## 2022-10-01 DIAGNOSIS — F4310 Post-traumatic stress disorder, unspecified: Secondary | ICD-10-CM | POA: Diagnosis not present

## 2022-10-01 DIAGNOSIS — F321 Major depressive disorder, single episode, moderate: Secondary | ICD-10-CM | POA: Diagnosis not present

## 2022-10-01 DIAGNOSIS — F411 Generalized anxiety disorder: Secondary | ICD-10-CM | POA: Diagnosis not present

## 2022-10-01 MED ORDER — SERTRALINE HCL 100 MG PO TABS: 100.0000 mg | ORAL_TABLET | Freq: Every day | ORAL | 2 refills | Status: DC

## 2022-10-01 MED ORDER — ARIPIPRAZOLE 10 MG PO TABS: 10.0000 mg | ORAL_TABLET | Freq: Every day | ORAL | 2 refills | Status: DC

## 2022-10-01 MED ORDER — TRAZODONE HCL 100 MG PO TABS: 100.0000 mg | ORAL_TABLET | Freq: Every day | ORAL | 2 refills | Status: DC

## 2022-10-01 NOTE — Progress Notes (Addendum)
BH MD/PA/NP OP Progress Note  10/01/2022 2:22 PM Richard Roberson  MRN:  [id number]  Chief Complaint:  Chief Complaint  Patient presents with   Follow-up  Virtual Visit via Telephone Note  I connected with Richard Bullahristopher H Roberson on 10/01/22 at 11:30 AM EST by telephone and verified that I am speaking with the correct person using two identifiers.  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 I also discussed with the patient that there may be a patient responsible charge related to this service. The patient expressed understanding and agreed to proceed.   History of Present Illness: Richard Roberson is a 31 year old MTF with a past psychiatric history significant for major depressive disorder, PTSD, generalized anxiety disorder, and insomnia who [facility] via virtual video visit for follow-up and medication management.   Patient reports she has been out of his medications for multiple months but has been following up with his therapist.  Patient reports that she feels as though he is back on his medication endorsing he has noticed worsening depression and anxiety without the medications.  Patient reports that she felt that the medications assisted with all of these symptoms.  Patient reports that since last seen this provider she has had multiple complications in life.  Patient reports that her 6-year relationship abruptly ended, he had to move apartments 2 times due to complications, and she was unemployed.  Patient reports that in the last few weeks he has found a new partner who is allowing them to stay with her.  Patient reports she is also started HRT to transition to a male.  Patient reports that she also started her new job at [employer] this  Monday.  Patient reports that overall it is going fairly well and that she knows that this is the busy season due to the holidays.  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  Patient also reports that she feels as though she is constantly on edge as though her heart is always racing.  Patient endorses that her partner has also made comments about patient having more irritability lately, to which patient endorses are likely true.  Patient reports that the holiday season can be difficult due to it being around the anniversary of his mother's death however, he does feel that her current depressive mood is worse than normal.  Patient denies SI and HI but endorses that he is still having occasional AH where he hears someone calling her "dead name" endorses that she believes the voice sounds similar to her mother.  Patient endorses that she is able to reality test, endorsing that she is also noticed that these "auditory hallucinations" occur when she is alone.  Patient denies having any AVH       I discussed the assessment and treatment plan with the patient. The patient was provided an opportunity to ask questions and all were answered. The patient agreed with the plan and demonstrated an understanding of the instructions.   The patient was advised to call back or seek an in-person evaluation if the symptoms worsen or if the  condition fails to improve as anticipated.  I provided 25 minutes of non-face-to-face time during this encounter.   Richard Morton, MD  Visit Diagnosis:    ICD-10-CM   1. GAD (generalized anxiety disorder)  F41.1 sertraline (ZOLOFT) 100 MG tablet    2. PTSD (post-traumatic stress disorder)  F43.10 sertraline (ZOLOFT) 100 MG tablet    3. Current  moderate episode of major depressive disorder, unspecified whether recurrent (HCC)  F32.1 sertraline (ZOLOFT) 100 MG tablet    ARIPiprazole (ABILIFY) 10 MG tablet    4. Insomnia, unspecified type  G47.00 traZODone (DESYREL) 100 MG tablet      Past Psychiatric History: Borderline personality disorder Generalized anxiety disorder PTSD Major depressive disorder Insomnia  Past Medical History:  Past Medical History:  Diagnosis Date   Asthma    as a child   Depression    Suicide (HCC)     Past Surgical History:  Procedure Laterality Date   CHOLECYSTECTOMY     LEG SURGERY Right     Family Psychiatric History: unknown  Family History:  Family History  Problem Relation Age of Onset   Diabetes Mother    Heart failure Mother    Hypertension Mother    Stroke Mother     Social History:  Social History   Socioeconomic History   Marital status: Single    Spouse name: Not on file   Number of children: Not on file   Years of education: Not on file   Highest education level: Not on file  Occupational History   Not on file  Tobacco Use   Smoking status: Former    Types: E-cigarettes   Smokeless tobacco: Former    Types: Associate Professor Use: Every day   Substances: Nicotine, Flavoring  Substance and Sexual Activity   Alcohol use: Yes    Comment: socially   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   Sexual activity: Yes  Other Topics Concern   Not on file  Social History Narrative   Not on file   Social Determinants of Health   Financial Resource Strain: High Risk (03/20/2021)   Overall Financial Resource Strain (CARDIA)    Difficulty of Paying Living Expenses: Hard  Food Insecurity: Food Insecurity Present (03/20/2021)   Hunger Vital Sign    Worried About Running Out of Food in the Last Year: Sometimes true    Ran Out of Food in the Last Year: Sometimes true  Transportation Needs: No Transportation Needs (03/20/2021)   PRAPARE -  Administrator, Civil Service (Medical): No    Lack of Transportation (Non-Medical): No  Physical Activity: Insufficiently Active (03/20/2021)   Exercise Vital Sign    Days of Exercise per Week: 1 day    Minutes of Exercise per Session: 60 min  Stress: Stress Concern Present (03/20/2021)   [employer] of Occupational Health - Occupational Stress Questionnaire    Feeling of Stress : Very much  Social Connections: Socially Isolated (03/20/2021)   Social Connection and Isolation Panel [NHANES]    Frequency of Communication with Friends and Family: Never    Frequency of Social Gatherings with Friends and Family: Once a week    Attends Religious Services: Never    Database administrator or Organizations: No    Attends Banker Meetings: Never    Marital Status: Living with partner    Allergies:  Allergies  Allergen Reactions   Betadine [  Povidone Iodine] Other (See Comments)    Had surgery and incision got infected whiling healing with cast on     Metabolic Disorder Labs: No results found for: "HGBA1C", "MPG" No results found for: "PROLACTIN" No results found for: "CHOL", "TRIG", "HDL", "CHOLHDL", "VLDL", "LDLCALC" No results found for: "TSH"  Therapeutic Level Labs: No results found for: "LITHIUM" No results found for: "VALPROATE" No results found for: "CBMZ"  Current Medications: Current Outpatient Medications  Medication Sig Dispense Refill   albuterol (PROVENTIL HFA;VENTOLIN HFA) 108 (90 Base) MCG/ACT inhaler Inhale 1-2 puffs into the lungs every 6 (six) hours as needed for wheezing or shortness of breath. 1 Inhaler 0   ARIPiprazole (ABILIFY) 10 MG tablet Take 1 tablet (10 mg total) by mouth daily. 30 tablet 2   azithromycin (ZITHROMAX) 250 MG tablet Take 1 tablet (250 mg total) by mouth daily. Take first 2 tablets together, then 1 every day until finished. 6 tablet 0   calcium carbonate (TUMS - DOSED IN MG ELEMENTAL CALCIUM) 500 MG chewable tablet  Chew 1-2 tablets by mouth as needed for indigestion or heartburn.     cyclobenzaprine (FLEXERIL) 10 MG tablet Take 1 tablet (10 mg total) by mouth 2 (two) times daily as needed for muscle spasms. 20 tablet 0   hydrOXYzine (ATARAX/VISTARIL) 25 MG tablet Take 1 tablet (25 mg total) by mouth 3 (three) times daily as needed for anxiety. 12 tablet 0   ibuprofen (ADVIL,MOTRIN) 200 MG tablet Take 800 mg by mouth every 6 (six) hours as needed for moderate pain.      lidocaine (LIDODERM) 5 % Place 1 patch onto the skin daily. Remove & Discard patch within 12 hours or as directed by MD 15 patch 0   methylPREDNISolone (MEDROL DOSEPAK) 4 MG TBPK tablet Please follow the instructions on the Dosepak to take the steroids. 21 each 0   sertraline (ZOLOFT) 100 MG tablet Take 1 tablet (100 mg total) by mouth daily. Take 50mg  (take half a pill for 3 days) then increase to 100mg . 30 tablet 2   traZODone (DESYREL) 100 MG tablet Take 1 tablet (100 mg total) by mouth at bedtime. 30 tablet 2   No current facility-administered medications for this visit.     Musculoskeletal: Defer  Psychiatric Specialty Exam: Review of Systems  Psychiatric/Behavioral:  Positive for agitation, dysphoric mood and hallucinations. Negative for suicidal ideas. The patient is nervous/anxious.     There were no vitals taken for this visit.There is no height or weight on file to calculate BMI.  General Appearance: NA  Eye Contact:  NA  Speech:  Clear and Coherent  Volume:  Normal  Mood:  Anxious  Affect:  NA  Thought Process:  Coherent  Orientation:  Full (Time, Place, and Person)  Thought Content: Logical   Suicidal Thoughts:  No  Homicidal Thoughts:  No  Memory:  Immediate;   Good Recent;   Good  Judgement:  Good  Insight:  Fair  Psychomotor Activity:  NA  Concentration:  Concentration: Fair  Recall:  NA  Fund of Knowledge: Good  Language: Good  Akathisia:  NA  Handed:    AIMS (if indicated): not done  Assets:   Communication Skills Desire for Improvement Housing Intimacy Resilience Social Support Transportation  ADL's:  Intact  Cognition: WNL  Sleep:  Fair    Screenings: AUDIT    from 03/20/2021 in [facility]  Alcohol Use Disorder Identification Test Final Score (AUDIT) 8  GAD-7    Flowsheet Row Counselor from 09/15/2022 in [facility] Counselor from 07/08/2022 in [facility] Counselor from 06/16/2022 in [facility] Counselor from 05/27/2022 in [facility] Counselor from 04/15/2022 in [facility]  Total GAD-7 Score 15 9 7 12 12       PHQ2-9    Flowsheet Row Counselor from 09/15/2022 in [facility] Counselor from 07/08/2022 in [facility] Counselor from 06/16/2022 in [facility] Counselor from 05/27/2022 in [facility] Counselor from 04/15/2022 in [facility]  PHQ-2 Total Score 2 2 3 2 4   PHQ-9 Total Score 14 12 17 13 15       Flowsheet Row Counselor from 09/15/2022 in [facility] Counselor from 05/27/2022 in [facility] Counselor from 02/12/2022 in [facility]  C-SSRS RISK CATEGORY Error: Q2 is Yes, you must answer 3, 4, and 5 Low Risk Low Risk        Assessment and Plan:  Londen Bok is a 31 year old MTF with a past psychiatric history significant for major depressive disorder, PTSD, generalized anxiety disorder, and insomnia  Per therapy notes and on assessment today patient endorses multiple weeks to months of dysphoric mood with increased anxiety without medication assistance.  Patient has also recently started hormone therapy, which may be contributing to some  of the irritability however with patient's history of depression as well as multiple life stressors it would be best to help patient in this new transition in life by restarting psychotropic medications.  Will restart patient at lower doses due to being off the medications for months.  MDD Insomnia Anxiety Borderline personality disorder History of gender dysphoria (currently transitioning 10/01/2022)  - Restart Zoloft at 50 mg x 3 days then increase to 100 mg daily - Restart trazodone 100 mg nightly - Start Abilify 10 mg daily  Collaboration of Care: Collaboration of Care:   Patient/Guardian was advised Release of Information must be obtained prior to any record release in order to collaborate their care with an outside provider. Patient/Guardian was advised if they have not already done so to contact the registration department to sign all necessary forms in order for Richard Roberson to release information regarding their care.   Consent: Patient/Guardian gives verbal consent for treatment and assignment of benefits for services provided during this visit. Patient/Guardian expressed understanding and agreed to proceed.   PGY-3 34, MD 10/01/2022, 2:22 PM  I reviewed the patient's chart and discussed the patient and plan of care with the resident. I agree with the findings and plan as documented in the resident's note and above addendum. Plan to restart previously effective psychiatric medications as above given persistent depressive symptoms.    [place], MD 10/08/22

## 2022-10-21 ENCOUNTER — Ambulatory Visit (INDEPENDENT_AMBULATORY_CARE_PROVIDER_SITE_OTHER): Payer: No Payment, Other | Admitting: Licensed Clinical Social Worker

## 2022-10-21 DIAGNOSIS — F331 Major depressive disorder, recurrent, moderate: Secondary | ICD-10-CM

## 2022-10-21 DIAGNOSIS — F419 Anxiety disorder, unspecified: Secondary | ICD-10-CM | POA: Diagnosis not present

## 2022-10-21 DIAGNOSIS — F32A Depression, unspecified: Secondary | ICD-10-CM

## 2022-10-21 DIAGNOSIS — F411 Generalized anxiety disorder: Secondary | ICD-10-CM

## 2022-10-21 NOTE — Progress Notes (Signed)
THERAPIST PROGRESS NOTE  Virtual Visit via Video Note  I connected with Richard Roberson on 10/21/22 at  8:00 AM EST by a video enabled telemedicine application and verified that I am speaking with the correct person using two identifiers.  Location: Patient: Richard Roberson  Provider: Providers Home    I discussed the limitations of evaluation and management by telemedicine and the availability of in person appointments. The patient expressed understanding and agreed to proceed.     I discussed the assessment and treatment plan with the patient. The patient was provided an opportunity to ask questions and all were answered. The patient agreed with the plan and demonstrated an understanding of the instructions.   The patient was advised to call back or seek an in-person evaluation if the symptoms worsen or if the condition fails to improve as anticipated.  I provided 20 minutes of non-face-to-face time during this encounter.   Dory Horn, LCSW   Participation Level: Minimal  Behavioral Response: Fairly GroomedAlertDepressed  Type of Therapy: Individual Therapy  Treatment Goals addressed:  Active     Anxiety Disorder CCP Problem  1 GAD      walk 3 x weekly (Not Progressing)     Start:  12/14/21    Expected End:  03/11/23         Identify three trigger causing anxiety  (Progressing)     Start:  12/14/21    Expected End:  03/11/23         LTG: Patient will score less than 5 on the Generalized Anxiety Disorder 7 Scale (GAD-7) (Progressing)     Start:  12/14/21    Expected End:  03/11/23         STG: Patient will attend at least 80% of scheduled group psychotherapy sessions (Progressing)     Start:  12/14/21    Expected End:  03/11/23         STG: Patient will practice problem solving skills 3 times per week for the next 4 weeks (Progressing)     Start:  12/14/21    Expected End:  03/11/23           Depression CCP Problem  1 MDD     identify three  trigger causing depression.  (Progressing)     Start:  12/14/21    Expected End:  03/11/23         LTG: Richard Roberson WILL SCORE LESS THAN 10 ON THE PATIENT HEALTH QUESTIONNAIRE (PHQ-9) (Progressing)     Start:  12/14/21    Expected End:  03/11/23               Interventions: CBT, Motivational Interviewing, and Supportive   Suicidal/Homicidal: Nowithout intent/plan  Therapist Response:     Patient was alert oriented x 5. She was pleasant, cooperative, and maintained good eye contact. Patient presented today with depressed and anxious mood\ affect. She was dressed casually and engaged well in therapy session.   Patient reports today that she is "sick" quote. Patient reports that she has been running a fever for the past several days. Richard Roberson states that she attempted to call several times to reschedule and left 2 voicemails. LCSW explained staffing shortage due to sickness at the front desk. Richard Roberson reported understanding. Due to pt wanting to reschedule, LCSW only had 20 minutes of face-to-face interaction with pt.   Intervention/Plan: LCSW administered a PHQ-9. LCSW administered a GAD-7. LCSW reviewed scores with pt. LCSW notes pt progressed towards goal for both  PHQ-9 and GAD-7. LCSW used supportive therapy for praise and encouragement. LCSW and pt spoke about a plan for illness if pt does not improve on symptoms in next 24 hours urgent care should be next steps. Pt was agreeable today.   Plan: Return again in 3 weeks.  Diagnosis: No diagnosis found.  Collaboration of Care: Other None today   Patient/Guardian was advised Release of Information must be obtained prior to any record release in order to collaborate their care with an outside provider. Patient/Guardian was advised if they have not already done so to contact the registration department to sign all necessary forms in order for [place] to release information regarding their care.   Consent: Patient/Guardian gives verbal consent  for treatment and assignment of benefits for services provided during this visit. Patient/Guardian expressed understanding and agreed to proceed.   Dory Horn, LCSW 10/21/2022

## 2022-11-15 ENCOUNTER — Encounter (HOSPITAL_COMMUNITY): Payer: Self-pay

## 2022-11-15 ENCOUNTER — Ambulatory Visit (HOSPITAL_COMMUNITY): Payer: No Payment, Other | Admitting: Licensed Clinical Social Worker

## 2022-12-06 ENCOUNTER — Ambulatory Visit (HOSPITAL_COMMUNITY): Payer: No Payment, Other | Admitting: Licensed Clinical Social Worker

## 2022-12-06 DIAGNOSIS — F640 Transsexualism: Secondary | ICD-10-CM

## 2022-12-06 DIAGNOSIS — F331 Major depressive disorder, recurrent, moderate: Secondary | ICD-10-CM

## 2022-12-06 DIAGNOSIS — F411 Generalized anxiety disorder: Secondary | ICD-10-CM

## 2022-12-06 DIAGNOSIS — F4310 Post-traumatic stress disorder, unspecified: Secondary | ICD-10-CM

## 2022-12-06 NOTE — Progress Notes (Signed)
THERAPIST PROGRESS NOTE  Virtual Visit via Video Note  I connected with Richard Roberson on 12/06/22 at  2:00 PM EST by a video enabled telemedicine application and verified that I am speaking with the correct person using two identifiers.  Location: Patient: Richard Roberson  Provider: Providers Home    I discussed the limitations of evaluation and management by telemedicine and the availability of in person appointments. The patient expressed understanding and agreed to proceed.    I discussed the assessment and treatment plan with the patient. The patient was provided an opportunity to ask questions and all were answered. The patient agreed with the plan and demonstrated an understanding of the instructions.   The patient was advised to call back or seek an in-person evaluation if the symptoms worsen or if the condition fails to improve as anticipated.  I provided 40 minutes of non-face-to-face time during this encounter.   Dory Horn, LCSW   Participation Level: Active  Behavioral Response: CasualAlertAnxious and Depressed  Type of Therapy: Individual Therapy  Treatment Goals addressed:  Active     Anxiety Disorder CCP Problem  1 GAD      walk 3 x weekly (Not Progressing)     Start:  12/14/21    Expected End:  03/11/23         Identify three trigger causing anxiety  (Progressing)     Start:  12/14/21    Expected End:  03/11/23         LTG: Patient will score less than 5 on the Generalized Anxiety Disorder 7 Scale (GAD-7) (Not Progressing)     Start:  12/14/21    Expected End:  03/11/23         STG: Patient will attend at least 80% of scheduled group psychotherapy sessions (Progressing)     Start:  12/14/21    Expected End:  03/11/23         STG: Patient will practice problem solving skills 3 times per week for the next 4 weeks (Progressing)     Start:  12/14/21    Expected End:  03/11/23           Depression CCP Problem  1 MDD     identify  three trigger causing depression.  (Progressing)     Start:  12/14/21    Expected End:  03/11/23         LTG: [place] WILL SCORE LESS THAN 10 ON THE PATIENT HEALTH QUESTIONNAIRE (PHQ-9) (Not Progressing)     Start:  12/14/21    Expected End:  03/11/23           ProgressTowards Goals: Progressing  Interventions: Motivational Interviewing and Supportive    Suicidal/Homicidal: Nowithout intent/plan  Therapist Response:    Pt was alert and oriented x 5. She was dressed casually and engaged well in therapy session. Richard Roberson was pleasant, cooperative, and maintained good eye contact. She presented with depressed and anxious mood/affect.   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 Pt reports that his car got repossessed. 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 Pt reports that this increased his depression and anxiety. She states that because of the increased depression she was not putting in the effort into her relationship which cause her significant other to break things off. Richard Roberson states today "it was my fault" She reports that they still live together, and things have been civilized if financial obligation are  being met for rent. Richard Roberson states that she makes about 500.00 selling her plasma.   Interventions/Plan: LCSW used psychoanalytic therapy for pt to express thoughts, feelings, and emotions. LCSW used supportive therapy for praise and encouragement. LCSW administered a PHQ-9. LCSW administered a GAD-7. LCSW notes an increase in both. LCSW educated pt on taking medications as prescribed.  Plan: Return again in 1 weeks.  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 Patient/Guardian was advised if they have not already done so to contact the registration department to sign all necessary forms in order for [place] to release information regarding their care.   Consent: Patient/Guardian gives verbal consent for treatment and assignment of benefits for services provided during this visit. Patient/Guardian expressed understanding and agreed to proceed.   Dory Horn, LCSW 12/06/2022

## 2022-12-09 ENCOUNTER — Encounter (HOSPITAL_COMMUNITY): Payer: Self-pay | Admitting: Student in an Organized Health Care Education/Training Program

## 2022-12-09 ENCOUNTER — Telehealth (INDEPENDENT_AMBULATORY_CARE_PROVIDER_SITE_OTHER): Payer: No Payment, Other | Admitting: Student in an Organized Health Care Education/Training Program

## 2022-12-09 ENCOUNTER — Telehealth (HOSPITAL_COMMUNITY): Payer: No Payment, Other | Admitting: Student in an Organized Health Care Education/Training Program

## 2022-12-09 DIAGNOSIS — G4700 Insomnia, unspecified: Secondary | ICD-10-CM | POA: Diagnosis not present

## 2022-12-09 DIAGNOSIS — F321 Major depressive disorder, single episode, moderate: Secondary | ICD-10-CM | POA: Diagnosis not present

## 2022-12-09 DIAGNOSIS — F4310 Post-traumatic stress disorder, unspecified: Secondary | ICD-10-CM

## 2022-12-09 DIAGNOSIS — F411 Generalized anxiety disorder: Secondary | ICD-10-CM

## 2022-12-09 MED ORDER — ARIPIPRAZOLE 10 MG PO TABS: 10.0000 mg | ORAL_TABLET | Freq: Every day | ORAL | 1 refills | Status: DC

## 2022-12-09 MED ORDER — TRAZODONE HCL 100 MG PO TABS: 100.0000 mg | ORAL_TABLET | Freq: Every day | ORAL | 1 refills | Status: DC

## 2022-12-09 MED ORDER — SERTRALINE HCL 100 MG PO TABS: 150.0000 mg | ORAL_TABLET | Freq: Every day | ORAL | 0 refills | Status: DC

## 2022-12-09 NOTE — Patient Instructions (Addendum)
   Resources  "The [place]" At the [facility] [employer]) This book provides information on places to obtain food and other resources in the [place] area. Address: [address], [place], [place] [address]  2. " http://bell-fernandez.com/" Online resources that displays food pantries, housing options etc. in [place] area  3. "[place]" is located near the courthouse, ONLY members of the [place] community are able to come here and get resources including a daily free bus pass. Address: [address] [place], [place] [address]

## 2022-12-09 NOTE — Addendum Note (Signed)
Addended by: Damita Dunnings B on: 12/09/2022 12:29 PM   Modules accepted: Level of Service

## 2022-12-09 NOTE — Progress Notes (Addendum)
[place] MD/PA/NP OP Progress Note  12/09/2022 11:17 AM Richard Roberson  MRN:  [id number]  Chief Complaint:  Chief Complaint  Patient presents with   Follow-up  Virtual Visit via Video Note  I connected with Richard Roberson on 12/09/22 at 10:30 AM EST by a video enabled telemedicine application and verified that I am speaking with the correct person using two identifiers.  Location: Patient: Home Provider: Office   I discussed the limitations of evaluation and management by telemedicine and the availability of in person appointments. The patient expressed understanding and agreed to proceed.  History of Present Illness: Richard Roberson is a 32 year old MTF with a past psychiatric history significant for major depressive disorder, PTSD, generalized anxiety disorder, and insomnia who presents to [facility] via virtual video visit for follow-up and medication management.  Patient is currently being managed on the following medications:  Zoloft 100 mg daily Trazodone 100 mg nightly Abilify 10 mg daily  Patient endorses that they have been compliant with his medications. Patient reports that she has been struggling financially and lost their job and their romantic relationship. Patient reports that she is still living with the partner, but says it is amicable. Patient reports that she is able to meet that she is able to pay the bills he is required as her ex- partner requires. Patient reports that she is trying to remain positive. Patient reports that she is struggling to sleep, but finds that the trazodone helps. Patient reports that nights were she is more stressed, she struggles to fall asleep and will feel exhausted the rest of the day. Patient reports a stable appetite. Patient reports that she had one thought of SI since the break-up 11/24/2022, but did not act on it. Patient endorses that she had also had one or two fleeting thoughts of  passive SI around the time of the break-up, but this has not reoccurred since. Patient reports that she is thinking about the way she has gotten out of worse situation in the past, and this helps to motivate her. Patient denies HI is still hearing a male or male voice call out his name when no one is around. Patient reports that the [facility] increases when depressed or alone. Patient reports that sometimes she will talk to the voice in her head (not her own) when very stressed or depressed, but then the voice doesn't respond. Patient reports that she does not think she is having a regular internal monologue,and is instead seeking comfort in the voices she had been hearing. Patient denies VH.   Patient reports that she is having interviews and feels like she has been able to do ok on interviews.   Patient reports that some days she is able to find joy in activities and other days struggles with anhedonia. Patient endorses that the anhedonia is increasing and friends feel that the patient is dissociating and isolating.  Patient reports that she will try to get out of the isolative state, but even going for walks alone can be hard. Patient endorses feeling hopeless a bout half the days in a week. Patient also endorses having low energy.  Patient reports sober from [place]. Patient reports no THC use. Patient is still on hormone therapy.   I discussed the assessment and treatment plan with the patient. The patient was provided an opportunity to ask questions and all were answered. The patient agreed with the plan and demonstrated an understanding of the instructions.  The patient was advised to call back or seek an in-person evaluation if the symptoms worsen or if the condition fails to improve as anticipated.  I provided 25 minutes of non-face-to-face time during this encounter.   Freida Busman, MD  Visit Diagnosis:    ICD-10-CM   1. Current moderate episode of major depressive disorder, unspecified whether  recurrent (HCC)  F32.1 ARIPiprazole (ABILIFY) 10 MG tablet    sertraline (ZOLOFT) 100 MG tablet    2. GAD (generalized anxiety disorder)  F41.1 sertraline (ZOLOFT) 100 MG tablet    3. PTSD (post-traumatic stress disorder)  F43.10 sertraline (ZOLOFT) 100 MG tablet    4. Insomnia, unspecified type  G47.00 traZODone (DESYREL) 100 MG tablet      Past Psychiatric History:   Borderline personality disorder Generalized anxiety disorder PTSD Major depressive disorder Insomnia  Hx of Seroquel  Past Medical History:  Past Medical History:  Diagnosis Date   Asthma    as a child   Depression    Suicide ([place])     Past Surgical History:  Procedure Laterality Date   CHOLECYSTECTOMY     LEG SURGERY Right     Family Psychiatric History: unknown  Family History:  Family History  Problem Relation Age of Onset   Diabetes Mother    Heart failure Mother    Hypertension Mother    Stroke Mother     Social History:  Social History   Socioeconomic History   Marital status: Single    Spouse name: Not on file   Number of children: Not on file   Years of education: Not on file   Highest education level: Not on file  Occupational History   Not on file  Tobacco Use   Smoking status: Former    Types: E-cigarettes   Smokeless tobacco: Former    Types: Nurse, children's Use: Every day   Substances: Nicotine, Flavoring  Substance and Sexual Activity   Alcohol use: Yes    Comment: socially   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   Sexual activity: Yes  Other Topics Concern   Not on file  Social History Narrative   Not on file   Social Determinants of Health   Financial Resource Strain: High Risk (03/20/2021)   Overall Financial Resource Strain (CARDIA)    Difficulty of Paying Living Expenses: Hard  Food Insecurity: Food Insecurity Present (03/20/2021)   Hunger Vital Sign    Worried About Running Out of Food in the Last Year: Sometimes true    Ran  Out of Food in the Last Year: Sometimes true  Transportation Needs: No Transportation Needs (03/20/2021)   PRAPARE - Hydrologist (Medical): No    Lack of Transportation (Non-Medical): No  Physical Activity: Insufficiently Active (03/20/2021)   Exercise Vital Sign    Days of Exercise per Week: 1 day    Minutes of Exercise per Session: 60 min  Stress: Stress Concern Present (03/20/2021)   [place]    Feeling of Stress : Very much  Social Connections: Socially Isolated (03/20/2021)   Social Connection and Isolation Panel [NHANES]    Frequency of Communication with Friends and Family: Never    Frequency of Social Gatherings with Friends and Family: Once a week    Attends Religious Services: Never    Marine scientist or Organizations: No    Attends  Club or Organization Meetings: Never    Marital Status: Living with partner    Allergies:  Allergies  Allergen Reactions   Betadine [Povidone Iodine] Other (See Comments)    Had surgery and incision got infected whiling healing with cast on     Metabolic Disorder Labs: No results found for: "HGBA1C", "MPG" No results found for: "PROLACTIN" No results found for: "CHOL", "TRIG", "HDL", "CHOLHDL", "VLDL", "LDLCALC" No results found for: "TSH"  Therapeutic Level Labs: No results found for: "LITHIUM" No results found for: "VALPROATE" No results found for: "CBMZ"  Current Medications: Current Outpatient Medications  Medication Sig Dispense Refill   albuterol (PROVENTIL HFA;VENTOLIN HFA) 108 (90 Base) MCG/ACT inhaler Inhale 1-2 puffs into the lungs every 6 (six) hours as needed for wheezing or shortness of breath. 1 Inhaler 0   ARIPiprazole (ABILIFY) 10 MG tablet Take 1 tablet (10 mg total) by mouth daily. 90 tablet 1   azithromycin (ZITHROMAX) 250 MG tablet Take 1 tablet (250 mg total) by mouth daily. Take first 2 tablets together, then 1  every day until finished. 6 tablet 0   calcium carbonate (TUMS - DOSED IN MG ELEMENTAL CALCIUM) 500 MG chewable tablet Chew 1-2 tablets by mouth as needed for indigestion or heartburn.     cyclobenzaprine (FLEXERIL) 10 MG tablet Take 1 tablet (10 mg total) by mouth 2 (two) times daily as needed for muscle spasms. 20 tablet 0   ibuprofen (ADVIL,MOTRIN) 200 MG tablet Take 800 mg by mouth every 6 (six) hours as needed for moderate pain.      lidocaine (LIDODERM) 5 % Place 1 patch onto the skin daily. Remove & Discard patch within 12 hours or as directed by MD 15 patch 0   methylPREDNISolone (MEDROL DOSEPAK) 4 MG TBPK tablet Please follow the instructions on the Dosepak to take the steroids. 21 each 0   sertraline (ZOLOFT) 100 MG tablet Take 1.5 tablets (150 mg total) by mouth daily. Take '50mg'$  (take half a pill for 3 days) then increase to '100mg'$ . 135 tablet 0   traZODone (DESYREL) 100 MG tablet Take 1 tablet (100 mg total) by mouth at bedtime. 90 tablet 1   No current facility-administered medications for this visit.     Musculoskeletal:   Psychiatric Specialty Exam: Review of Systems  Psychiatric/Behavioral:  Positive for dysphoric mood, hallucinations and sleep disturbance. Negative for suicidal ideas.     There were no vitals taken for this visit.There is no height or weight on file to calculate BMI.  General Appearance: Casual  Eye Contact:  Good  Speech:  Clear and Coherent  Volume:  Normal  Mood:  Depressed  Affect:  Appropriate and Congruent  Thought Process:  Coherent  Orientation:  Full (Time, Place, and Person)  Thought Content: Logical   Suicidal Thoughts:  No  Homicidal Thoughts:  No  Memory:  Immediate;   Good Recent;   Good  Judgement:  Fair  Insight:  Fair  Psychomotor Activity:  NA  Concentration:  Concentration: Good  Recall:  NA  Fund of Knowledge: Good  Language: Good  Akathisia:  No  Handed:    AIMS (if indicated): not done  Assets:  Communication  Skills Desire for Improvement Housing Resilience  ADL's:  Intact  Cognition: WNL  Sleep:  Fair   Screenings: AUDIT    Health and safety inspector from 03/20/2021 in [facility]  Alcohol Use Disorder Identification Test Final Score (AUDIT) 8      GAD-7  Health and safety inspector from 12/06/2022 in [facility] Counselor from 10/21/2022 in [facility] Counselor from 09/15/2022 in [facility] Counselor from 07/08/2022 in [facility] Counselor from 06/16/2022 in [facility]  Total GAD-7 Score '15 8 15 9 7      '$ PHQ2-9    Flowsheet Row Video Visit from 12/09/2022 in [facility] Counselor from 12/06/2022 in [facility] Counselor from 10/21/2022 in [facility] Counselor from 09/15/2022 in [facility] Counselor from 07/08/2022 in [place]  PHQ-2 Total Score '5 5 3 2 2  '$ PHQ-9 Total Score '19 20 13 14 12      '$ Flowsheet Row Counselor from 12/06/2022 in [facility] Counselor from 10/21/2022 in [facility] Counselor from 09/15/2022 in [place] CATEGORY Low Risk Low Risk Error: Q2 is Yes, you must answer 3, 4, and 5        Assessment and Plan:  Richard Roberson is a 32 year old MTF with a past psychiatric history significant for major depressive disorder, PTSD, generalized anxiety disorder, and insomnia.  In the future, may  consider titrating patient off of Abilify as patient's reported auditor hallucinations have never improved and the medication and may be secondary to PTSD diagnosis.  Based on assessment today patient reports only hearing his name called, but does not report an voice in her  head that is able to hold a conversation with her.  It appears that the voices may be more of an unconscious coping mechanism especially given they only worsen patient is more dysphoric or feels alone.  Immature coping mechanisms would also align with patient's borderline personality disorder diagnosis in the setting of concurrent PTSD diagnosis.  Patient continues to score fairly high on the PHQ-9, in the moderate to severe range we will increase patient's Zoloft in an attempt to decrease chances of patient decompensating.  Patient does appear to be able to process and cope somewhat with current stressors on current medication regimen therefore we will not discontinue any medications today, and will actually increase Zoloft.  MDD, recurrent Hx GAD Hx PTSD Hx gender dysphoria-resolved currently in MTF, continuing hormone therapy  - Increase Zoloft to '150mg'$  -Continue Abilify 10 mg daily - Continue trazodone 100 mg nightly -Provider placed resources in patient's chart regarding access to food and other resources   Follow-up in approximately 2 months  Collaboration of Care: Collaboration of Care:   Patient/Guardian was advised Release of Information must be obtained prior to any record release in order to collaborate their care with an outside provider. Patient/Guardian was advised if they have not already done so to contact the registration department to sign all necessary forms in order for [place] to release information regarding their care.   Consent: Patient/Guardian gives verbal consent for treatment and assignment of benefits for services provided during this visit. Patient/Guardian expressed understanding and agreed to proceed.   PGY-3 Freida Busman, MD 12/09/2022, 11:17 AM

## 2022-12-15 ENCOUNTER — Ambulatory Visit (HOSPITAL_COMMUNITY): Payer: No Payment, Other | Admitting: Licensed Clinical Social Worker

## 2023-01-06 ENCOUNTER — Ambulatory Visit (HOSPITAL_COMMUNITY): Payer: No Payment, Other | Admitting: Licensed Clinical Social Worker

## 2023-01-10 ENCOUNTER — Ambulatory Visit (HOSPITAL_COMMUNITY): Payer: No Payment, Other | Admitting: Licensed Clinical Social Worker

## 2023-01-10 ENCOUNTER — Encounter (HOSPITAL_COMMUNITY): Payer: Self-pay

## 2023-01-10 ENCOUNTER — Telehealth (HOSPITAL_COMMUNITY): Payer: Self-pay | Admitting: Licensed Clinical Social Worker

## 2023-01-10 NOTE — Telephone Encounter (Signed)
LCSW sent two links to pt phone with no response. LCSW f/u with PC and pt stated he got called in for an interview. LCSW will mark pt as left without being seen and reschedule for May 3rd 9am

## 2023-01-12 ENCOUNTER — Ambulatory Visit (INDEPENDENT_AMBULATORY_CARE_PROVIDER_SITE_OTHER): Payer: No Payment, Other | Admitting: Licensed Clinical Social Worker

## 2023-01-12 DIAGNOSIS — F411 Generalized anxiety disorder: Secondary | ICD-10-CM

## 2023-01-12 DIAGNOSIS — F640 Transsexualism: Secondary | ICD-10-CM | POA: Diagnosis not present

## 2023-01-12 DIAGNOSIS — F4310 Post-traumatic stress disorder, unspecified: Secondary | ICD-10-CM

## 2023-01-12 DIAGNOSIS — F321 Major depressive disorder, single episode, moderate: Secondary | ICD-10-CM

## 2023-01-12 NOTE — Progress Notes (Signed)
THERAPIST PROGRESS NOTE  Virtual Visit via Video Note  I connected with DERION HOLLIFIELD on 01/12/23 at  1:00 PM EDT by a video enabled telemedicine application and verified that I am speaking with the correct person using two identifiers.  Location: Patient: [facility]  Provider: Providers Home    I discussed the limitations of evaluation and management by telemedicine and the availability of in person appointments. The patient expressed understanding and agreed to proceed.     I discussed the assessment and treatment plan with the patient. The patient was provided an opportunity to ask questions and all were answered. The patient agreed with the plan and demonstrated an understanding of the instructions.   The patient was advised to call back or seek an in-person evaluation if the symptoms worsen or if the condition fails to improve as anticipated.  I provided 45 minutes of non-face-to-face time during this encounter.   Dory Horn, LCSW   Participation Level: Active  Behavioral Response: CasualAlertAnxious and Depressed  Type of Therapy: Individual Therapy  Treatment Goals addressed:  Active     Anxiety Disorder CCP Problem  1 GAD      walk 3 x weekly (Not Progressing)     Start:  12/14/21    Expected End:  03/11/23         Identify three trigger causing anxiety  (Progressing)     Start:  12/14/21    Expected End:  03/11/23       Goal Note     Financials          LTG: Patient will score less than 5 on the Generalized Anxiety Disorder 7 Scale (GAD-7) (Progressing)     Start:  12/14/21    Expected End:  03/11/23       Goal Note     9 today          STG: Patient will attend at least 80% of scheduled group psychotherapy sessions (Progressing)     Start:  12/14/21    Expected End:  03/11/23         STG: Patient will practice problem solving skills 3 times per week for the next 4 weeks (Progressing)     Start:  12/14/21    Expected  End:  03/11/23           Depression CCP Problem  1 MDD     identify three trigger causing depression.  (Progressing)     Start:  12/14/21    Expected End:  03/11/23         LTG: [place] WILL SCORE LESS THAN 10 ON THE PATIENT HEALTH QUESTIONNAIRE (PHQ-9) (Progressing)     Start:  12/14/21    Expected End:  03/11/23       Goal Note     11 today             ProgressTowards Goals: Progressing  Interventions: Motivational Interviewing and Supportive  Summary: DWANE VONGUNTEN is a 32 y.o. adult who presents with depressed and anxious mood\affect.  Patient was pleasant, cooperative, maintained good eye contact.  She engaged well in therapy session was dressed casually.  Patient reports good news of finding employment full-time.  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  Patient reports hours of operation will be Mondays, Tuesdays, Wednesdays, and Fridays from 8-4 and on Thursdays from 7-4.  LCSW and patient discussed scheduling moving forward and patient  requested that 4 PM appointment made.  LCSW notes that patient has an appointment on May 3 at 9 AM and patient opted to keep that appointment at this time until she can talk to his boss.    Patient reports that they are looking more towards the here and now and not focusing on the past for the future.  Patient reports optimism and encouragement of her financial and housing situation..  Patient's goal is to save up money for independent housing without a roommate and save up for transportation of a vehicle as her vehicle recently got repossessed due to lack of payments being made.  Suicidal/Homicidal: Nowithout intent/plan  Therapist Response:     Intervention\plan: LCSW psychoanalytic therapy for patient to express thoughts, feelings and emotions.  LCSW administered the GAD-7.  LCSW administered the PHQ-9.  LCSW notes and reviewed scores with patient.  Patient  progressing towards goals listed in treatment plan for PHQ-9 and GAD-7.  LCSW and patient spoke about triggers for anxiety and depression for financials.  Patient and LCSW discussed plan moving forward to attend orientation for new job and start gaining consistent thinking.  Plan: Return again in 3 weeks.  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 Patient/Guardian was advised if they have not already done so to contact the registration department to sign all necessary forms in order for [place] to release information regarding their care.   Consent: Patient/Guardian gives verbal consent for treatment and assignment of benefits for services provided during this visit. Patient/Guardian expressed understanding and agreed to proceed.   Dory Horn, LCSW 01/12/2023

## 2023-02-11 ENCOUNTER — Ambulatory Visit (INDEPENDENT_AMBULATORY_CARE_PROVIDER_SITE_OTHER): Payer: No Payment, Other | Admitting: Licensed Clinical Social Worker

## 2023-02-11 ENCOUNTER — Telehealth (INDEPENDENT_AMBULATORY_CARE_PROVIDER_SITE_OTHER): Payer: No Payment, Other | Admitting: Student in an Organized Health Care Education/Training Program

## 2023-02-11 DIAGNOSIS — F331 Major depressive disorder, recurrent, moderate: Secondary | ICD-10-CM

## 2023-02-11 DIAGNOSIS — F321 Major depressive disorder, single episode, moderate: Secondary | ICD-10-CM | POA: Diagnosis not present

## 2023-02-11 DIAGNOSIS — F411 Generalized anxiety disorder: Secondary | ICD-10-CM

## 2023-02-11 DIAGNOSIS — G4700 Insomnia, unspecified: Secondary | ICD-10-CM | POA: Diagnosis not present

## 2023-02-11 DIAGNOSIS — F640 Transsexualism: Secondary | ICD-10-CM

## 2023-02-11 DIAGNOSIS — Z5181 Encounter for therapeutic drug level monitoring: Secondary | ICD-10-CM

## 2023-02-11 DIAGNOSIS — F4310 Post-traumatic stress disorder, unspecified: Secondary | ICD-10-CM

## 2023-02-11 MED ORDER — SERTRALINE HCL 100 MG PO TABS
150.0000 mg | ORAL_TABLET | Freq: Every day | ORAL | 0 refills | Status: DC
Start: 2023-02-11 — End: 2023-04-01

## 2023-02-11 MED ORDER — ARIPIPRAZOLE 15 MG PO TABS: 15.0000 mg | ORAL_TABLET | Freq: Every day | ORAL | 2 refills | Status: DC

## 2023-02-11 MED ORDER — TRAZODONE HCL 100 MG PO TABS
100.0000 mg | ORAL_TABLET | Freq: Every day | ORAL | 0 refills | Status: DC
Start: 2023-02-11 — End: 2023-04-01

## 2023-02-11 NOTE — Progress Notes (Signed)
THERAPIST PROGRESS NOTE  Virtual Visit via Video Note  I connected with Richard Roberson on 02/11/23 at  9:00 AM EDT by a video enabled telemedicine application and verified that I am speaking with the correct person using two identifiers.  Location: Patient: [facility]  Provider: Providers    I discussed the limitations of evaluation and management by telemedicine and the availability of in person appointments. The patient expressed understanding and agreed to proceed.     I discussed the assessment and treatment plan with the patient. The patient was provided an opportunity to ask questions and all were answered. The patient agreed with the plan and demonstrated an understanding of the instructions.   The patient was advised to call back or seek an in-person evaluation if the symptoms worsen or if the condition fails to improve as anticipated.  I provided 30 minutes of non-face-to-face time during this encounter.   [name], LCSW   Participation Level: Active  Behavioral Response: CasualAlertAnxious and Depressed  Type of Therapy: Individual Therapy  Treatment Goals addressed:  Active     Anxiety Disorder CCP Problem  1 GAD      walk 3 x weekly (Progressing)     Start:  12/14/21    Expected End:  03/11/23       Goal Note     Self reports walking 2 x weekly          Identify three trigger causing anxiety  (Progressing)     Start:  12/14/21    Expected End:  03/11/23       Goal Note     Financials         LTG: Patient will score less than 5 on the Generalized Anxiety Disorder 7 Scale (GAD-7) (Progressing)     Start:  12/14/21    Expected End:  03/11/23       Goal Note     Pt scored same as last month/ 4 weeks ago (9)          STG: Patient will attend at least 80% of scheduled group psychotherapy sessions (Not Progressing)     Start:  12/14/21    Expected End:  03/11/23         STG: Patient will practice problem solving skills  3 times per week for the next 4 weeks (Progressing)     Start:  12/14/21    Expected End:  03/11/23           Depression CCP Problem  1 MDD     identify three trigger causing depression.  (Progressing)     Start:  12/14/21    Expected End:  03/11/23       Goal Note     Work          LTG: Richard Roberson WILL SCORE LESS THAN 10 ON THE PATIENT HEALTH QUESTIONNAIRE (PHQ-9) (Progressing)     Start:  12/14/21    Expected End:  03/11/23       Goal Note     Scored same as last month but has progressed from original score. Pt currently scoring an 11.             ProgressTowards Goals: Progressing  Interventions: Motivational Interviewing and Supportive  Summary: Richard Roberson is a 32 y.o. adult who presents with euthymic mood\affect.  Patient was pleasant, cooperative, maintained good eye contact.  She engaged well in therapy session was dressed casually.  Patient reports today that everything has  been going "well".  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  Patient reports that they are accepting of her transition and things have been going well.  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  Patient reports that she will not start this job until January 2025.  Patient reports optimism today.  Patient reports decrease in worthlessness and hopelessness the symptoms.  Patient reports that she is compliant with medication management.  Suicidal/Homicidal: Nowithout intent/plan  Therapist Response:    Intervention\plan: LCSW psychoanalytic therapy for patient to express thoughts, feelings and emotions.  LCSW administered GAD-7.  LCSW administered a PHQ-9.  LCSW notes no increase or decrease in PHQ-9 or GAD-7 scores today.  Patient is scoring lower than original score at time of comprehensive clinical assessment.  Patient self reports walking 2 times weekly with goal being 3 times  weekly.  Patient reports walks are 30 to 60 minutes at a time.  LCSW educated patient on the benefits of exercise to decrease depression and anxiety.  Plan: Return again in 3 weeks.  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 Patient/Guardian was advised if they have not already done so to contact the registration department to sign all necessary forms in order for [place] to release information regarding their care.   Consent: Patient/Guardian gives verbal consent for treatment and assignment of benefits for services provided during this visit. Patient/Guardian expressed understanding and agreed to proceed.   [name], LCSW 02/11/2023

## 2023-02-11 NOTE — Progress Notes (Signed)
Virtual Visit via Video Note  I connected with Richard Roberson on 02/11/23 at 10:30 AM EDT by a video enabled telemedicine application and verified that I am speaking with the correct person using two identifiers.  Location: Patient: At work, in Copy[employer], advertising: Office   I discussed the limitations of evaluation and management by telemedicine and the availability of in person appointments. The patient expressed understanding and agreed to proceed.     I discussed the assessment and treatment plan with the patient. The patient was provided an opportunity to ask questions and all were answered. The patient agreed with the plan and demonstrated an understanding of the instructions.   The patient was advised to call back or seek an in-person evaluation if the symptoms worsen or if the condition fails to improve as anticipated.  I provided 25 minutes of non-face-to-face time during this encounter.   Bobbye Morton, MD  [facility] MD/PA/NP OP Progress Note  02/11/2023 1:13 PM [name]  MRN:  [id number]  Chief Complaint:  Chief Complaint  Patient presents with   Follow-up   HPI: Richard Roberson is a 32 year old MTF with a past psychiatric history significant for major depressive disorder, PTSD, generalized anxiety disorder, and insomnia who presents to [facility] via virtual video visit for follow-up and medication management.  Patient is currently being managed on the following medications:   Zoloft 150 mg daily Trazodone 100 mg nightly Abilify 10 mg daily  Patient has a new job and endorses that this has helped. Patient reports that they feel their depression has improved. Patient reports that they are not sleeping well, endorsing getting 1-4hrs/ night. Patient endorses issues falling asleep. Patient reports that they think that their mind is racing. Patient reports that he also feels it is racing during the day around 2 hrs/  day moreso the last 2 weeks. Patient endorses the thoughts are usually about finances and catching up on things. Patient reports that her appetite is improving, and around 2 meals/ day.   Patient reports that their living situation is not to their liking, she endorses living with ex still. The weekends when both of them are in the home are rough, as she feels that when ex's mood is poor, this causes contention. Patient also reports that ex makes them feel stupid or puts them down.   Patient reports that they are able to still find joy in certain areas of life, like playing games with friends. Patient denies SI, and has not these in at least 1 month. She also denies HI. Patient reports that he is still hearing voices, that sound like mom calling her dead name and other voices telling patient she is not good enough or will fail. Patient also endorses that she will hear some "random screaming" but no one is around. Right now in therapy, she has been working on getting stable in life because she has been able to note that financial issues are biggest trigger for depression.   Patient reports that they have also been having more nightmares, that appear to represent patient's unconscious thoughts. Patient also endorses vivid dreaming. Patient will wake up hyperventilating. Patient reports that they feel like they still have feelings of hopelessness and worthlessness, but this has decreased some. Patient also reports that irritability is improving as well.   Patient does endorse that they sort of feel like they are on cocaine when they are trying to sleep.   THC- no Delta  products in 1 year Etoh- none in 3 years (started drinking at 32 yo) Vape- nicotine restarted recently.  Cocaine- none since 32 yo (started at 32 yo, to work 12 hrs shifts and go to school)  Visit Diagnosis:    ICD-10-CM   1. Medication monitoring encounter  Z51.81 Comprehensive Metabolic Panel (CMET)    Lipid Profile    HgB A1c    CBC  with Differential    2. Current moderate episode of major depressive disorder, unspecified whether recurrent (HCC)  F32.1 TSH    ARIPiprazole (ABILIFY) 15 MG tablet    sertraline (ZOLOFT) 100 MG tablet    3. Insomnia, unspecified type  G47.00 TSH    traZODone (DESYREL) 100 MG tablet    4. GAD (generalized anxiety disorder)  F41.1 TSH    sertraline (ZOLOFT) 100 MG tablet    5. PTSD (post-traumatic stress disorder)  F43.10 sertraline (ZOLOFT) 100 MG tablet      Past Psychiatric History: Borderline personality disorder Generalized anxiety disorder PTSD Major depressive disorder Insomnia   Hx of Seroquel  Past Medical History:  Past Medical History:  Diagnosis Date   Asthma    as a child   Depression    Suicide (HCC)     Past Surgical History:  Procedure Laterality Date   CHOLECYSTECTOMY     LEG SURGERY Right     Family Psychiatric History: M GMA and M Aunt: Bipolar  Family History:  Family History  Problem Relation Age of Onset   Diabetes Mother    Heart failure Mother    Hypertension Mother    Stroke Mother     Social History:  Social History   Socioeconomic History   Marital status: Single    Spouse name: Not on file   Number of children: Not on file   Years of education: Not on file   Highest education level: Not on file  Occupational History   Not on file  Tobacco Use   Smoking status: Former    Types: E-cigarettes   Smokeless tobacco: Former    Types: Associate Professor Use: Every day   Substances: Nicotine, Flavoring  Substance and Sexual Activity   Alcohol use: Yes    Comment: socially   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   Sexual activity: Yes  Other Topics Concern   Not on file  Social History Narrative   Not on file   Social Determinants of Health   Financial Resource Strain: High Risk (02/11/2023)   Overall Financial Resource Strain (CARDIA)    Difficulty of Paying Living Expenses: Hard  Food  Insecurity: Food Insecurity Present (02/11/2023)   Hunger Vital Sign    Worried About Running Out of Food in the Last Year: Often true    Ran Out of Food in the Last Year: Often true  Transportation Needs: No Transportation Needs (03/20/2021)   PRAPARE - Administrator, Civil Service (Medical): No    Lack of Transportation (Non-Medical): No  Physical Activity: Insufficiently Active (02/11/2023)   Exercise Vital Sign    Days of Exercise per Week: 2 days    Minutes of Exercise per Session: 60 min  Stress: Stress Concern Present (02/11/2023)   [employer] of Occupational Health - Occupational Stress Questionnaire    Feeling of Stress : To some extent  Social Connections: Moderately Isolated (02/11/2023)   Social Connection and Isolation Panel [NHANES]    Frequency of  Communication with Friends and Family: Three times a week    Frequency of Social Gatherings with Friends and Family: Never    Attends Religious Services: Never    Database administrator or Organizations: No    Attends Banker Meetings: Never    Marital Status: Living with partner    Allergies:  Allergies  Allergen Reactions   Betadine [Povidone Iodine] Other (See Comments)    Had surgery and incision got infected whiling healing with cast on     Metabolic Disorder Labs: No results found for: "HGBA1C", "MPG" No results found for: "PROLACTIN" No results found for: "CHOL", "TRIG", "HDL", "CHOLHDL", "VLDL", "LDLCALC" No results found for: "TSH"  Therapeutic Level Labs: No results found for: "LITHIUM" No results found for: "VALPROATE" No results found for: "CBMZ"  Current Medications: Current Outpatient Medications  Medication Sig Dispense Refill   albuterol (PROVENTIL HFA;VENTOLIN HFA) 108 (90 Base) MCG/ACT inhaler Inhale 1-2 puffs into the lungs every 6 (six) hours as needed for wheezing or shortness of breath. 1 Inhaler 0   ARIPiprazole (ABILIFY) 15 MG tablet Take 1 tablet (15 mg total) by  mouth daily. 30 tablet 2   azithromycin (ZITHROMAX) 250 MG tablet Take 1 tablet (250 mg total) by mouth daily. Take first 2 tablets together, then 1 every day until finished. 6 tablet 0   calcium carbonate (TUMS - DOSED IN MG ELEMENTAL CALCIUM) 500 MG chewable tablet Chew 1-2 tablets by mouth as needed for indigestion or heartburn.     cyclobenzaprine (FLEXERIL) 10 MG tablet Take 1 tablet (10 mg total) by mouth 2 (two) times daily as needed for muscle spasms. 20 tablet 0   ibuprofen (ADVIL,MOTRIN) 200 MG tablet Take 800 mg by mouth every 6 (six) hours as needed for moderate pain.      lidocaine (LIDODERM) 5 % Place 1 patch onto the skin daily. Remove & Discard patch within 12 hours or as directed by MD 15 patch 0   methylPREDNISolone (MEDROL DOSEPAK) 4 MG TBPK tablet Please follow the instructions on the Dosepak to take the steroids. 21 each 0   sertraline (ZOLOFT) 100 MG tablet Take 1.5 tablets (150 mg total) by mouth daily. 135 tablet 0   traZODone (DESYREL) 100 MG tablet Take 1 tablet (100 mg total) by mouth at bedtime. 90 tablet 0   No current facility-administered medications for this visit.      Psychiatric Specialty Exam: Review of Systems  Psychiatric/Behavioral:  Positive for sleep disturbance. Negative for dysphoric mood, hallucinations and suicidal ideas.     There were no vitals taken for this visit.There is no height or weight on file to calculate BMI.  General Appearance: Casual in work scrubs  Eye Contact:  Good  Speech:  Clear and Coherent  Volume:  Normal  Mood:  Euthymic  Affect:  Appropriate, Congruent, and brighter than previous assessments  Thought Process:  Coherent  Orientation:  Full (Time, Place, and Person)  Thought Content: Logical   Suicidal Thoughts:  No  Homicidal Thoughts:  No  Memory:  Immediate;   Good Recent;   Good  Judgement:  Fair  Insight:  Fair  Psychomotor Activity:  Normal  Concentration:  Concentration: Fair  Recall:  NA  Fund of  Knowledge: Good  Language: Good  Akathisia:  NA  Handed:    AIMS (if indicated): not done  Assets:  Communication Skills Desire for Improvement Resilience Vocational/Educational  ADL's:  Intact  Cognition: WNL  Sleep:  Poor  Screenings: AUDIT    Advertising copywriter from 03/20/2021 in [facility]  Alcohol Use Disorder Identification Test Final Score (AUDIT) 8      GAD-7    Flowsheet Row Counselor from 02/11/2023 in [facility] Counselor from 01/12/2023 in [facility] Counselor from 12/06/2022 in [facility] Counselor from 10/21/2022 in [facility] Counselor from 09/15/2022 in [facility]  Total GAD-7 Score 9 9 15 8 15       PHQ2-9    Flowsheet Row Counselor from 02/11/2023 in [facility] Counselor from 01/12/2023 in [facility] Video Visit from 12/09/2022 in [facility] Counselor from 12/06/2022 in [facility] Counselor from 10/21/2022 in [facility]  PHQ-2 Total Score 2 2 5 5 3   PHQ-9 Total Score 11 11 19 20 13       Flowsheet Row Counselor from 02/11/2023 in [facility] Counselor from 12/06/2022 in [facility] Counselor from 10/21/2022 in [facility]  C-SSRS RISK CATEGORY Low Risk Low Risk Low Risk        Assessment and Plan:   Patient is endorsing significant relief and depressive symptoms however, there is some concerns regarding patient's lack of sleep and not feeling well rested.  Patient also endorses feeling that her mind has been racing more over the last 2 weeks.  While some of this can be allocated to patient having a fairly new job, to some degree this job is helping alleviate  some of patient's financial stressors which patient identifies.  What is most concerning is patient endorses that he feels like they did on cocaine at night when trying to sleep.  Patient is on a higher dose of Zoloft at this time which could be concerning for possibly being pushed into a manic episode despite being on 10 mg of Abilify.  Patient does also have an additional family history of bipolar disorder.  There is some concern that patient is endorsing symptoms of hypomania.  A lot of patient may benefit from further increase in Zoloft to address PTSD related symptoms more specifically PTSD related hallucinations as well as continued anxiety, this may push patient into a full-blown manic episode.  Patient does not have a diagnosis of bipolar disorder patient does appear to have risk factors.  We will increase patient's Abilify to 15 mg instead to address mood and continue trazodone nightly.  Have also requested patient come to get lab work done given continuation of Abilify and lack of labs since 2019.  Patient endorsed understanding.  MDD, recurrent (R/O bipolar disorder) Hx GAD PTSD Hx gender dysphoria-resolved currently in MTF, continuing hormone therapy   - continue Zoloft to 150mg  - increase Abilify to 15 mg daily - Continue trazodone 100 mg nightly Labs: CBC, CMP, TSH, A1c, lipids     Discussed with patient pending transition of care to a new resident starting July 1st, and likely discontinuation of care by this provider at that time.   Collaboration of Care: Collaboration of Care:   Patient/Guardian was advised Release of Information must be obtained prior to any record release in order to collaborate their care with an outside provider. Patient/Guardian was advised if they have not already done so to contact the registration department to sign all necessary forms in order for [place] to release information  regarding their care.   Consent: Patient/Guardian gives verbal consent for treatment  and assignment of benefits for services provided during this visit. Patient/Guardian expressed understanding and agreed to proceed.   PGY-3 Bobbye Morton, MD 02/11/2023, 1:13 PM

## 2023-03-03 ENCOUNTER — Ambulatory Visit (INDEPENDENT_AMBULATORY_CARE_PROVIDER_SITE_OTHER): Payer: No Payment, Other | Admitting: Licensed Clinical Social Worker

## 2023-03-03 DIAGNOSIS — F331 Major depressive disorder, recurrent, moderate: Secondary | ICD-10-CM

## 2023-03-03 DIAGNOSIS — F640 Transsexualism: Secondary | ICD-10-CM

## 2023-03-03 NOTE — Progress Notes (Signed)
THERAPIST PROGRESS NOTE  Virtual Visit via Video Note  I connected with Richard Roberson on 03/03/23 at  4:00 PM EDT by a video enabled telemedicine application and verified that I am speaking with the correct person using two identifiers.  Location: Patient: [facility]  Provider: Providers Home    I discussed the limitations of evaluation and management by telemedicine and the availability of in person appointments. The patient expressed understanding and agreed to proceed.      I discussed the assessment and treatment plan with the patient. The patient was provided an opportunity to ask questions and all were answered. The patient agreed with the plan and demonstrated an understanding of the instructions.   The patient was advised to call back or seek an in-person evaluation if the symptoms worsen or if the condition fails to improve as anticipated.  I provided 30 minutes of non-face-to-face time during this encounter.   Richard Cooks, LCSW   Participation Level: Active  Behavioral Response: CasualAlertAnxious  Type of Therapy: Individual Therapy  Treatment Goals addressed:  Active     Anxiety Disorder CCP Problem  1 GAD      walk 3 x weekly (Progressing)     Start:  12/14/21    Expected End:  03/11/23       Goal Note     Self reports walking 2 x weekly          Identify three trigger causing anxiety  (Progressing)     Start:  12/14/21    Expected End:  03/11/23       Goal Note     Financials          LTG: Patient will score less than 5 on the Generalized Anxiety Disorder 7 Scale (GAD-7) (Progressing)     Start:  12/14/21    Expected End:  03/11/23       Goal Note     Pt scored same as last month/ 4 weeks ago (9)          STG: Patient will attend at least 80% of scheduled group psychotherapy sessions (Not Progressing)     Start:  12/14/21    Expected End:  03/11/23         STG: Patient will practice problem solving skills 3  times per week for the next 4 weeks (Progressing)     Start:  12/14/21    Expected End:  03/11/23           Depression CCP Problem  1 MDD     identify three trigger causing depression.  (Progressing)     Start:  12/14/21    Expected End:  03/11/23       Goal Note     Relationships          LTG: Richard Roberson WILL SCORE LESS THAN 10 ON THE PATIENT HEALTH QUESTIONNAIRE (PHQ-9) (Progressing)     Start:  12/14/21    Expected End:  03/11/23       Goal Note     Went from an 11 to a 9             ProgressTowards Goals: Progressing  Interventions: CBT and Motivational Interviewing   Suicidal/Homicidal: Nowithout intent/plan  Therapist Response:   Pt was alert and oriented x 5. She was dressed casually and engaged well in therapy session. She presented with depressed and anxious mood/affect. She was pleasant, cooperative and maintained good eye contact.  Pt reports things have been  going "good". She reports restarting estrogen treatment due to now having steady employment. 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 Pt reports that they have been supportive of her transition and her mental health.  Pt reports that things become a lot easier and more obtainable for her when she has a steady income which she reports as a primary factor to her improvement in depression for symptoms such as worthlessness and hopelessness.  Interventions/Plan: LCSW used supportive therapy for praise and encouragement. LCSW administered a GAD-7. LCSW administered a PHQ-9. LCSW used psychoanalytic therapy for pt to express thoughts, feeling and emotions in session. LCSW explained the importance of taking medications consistently.    Plan: Return again in 3 weeks.  Diagnosis: No diagnosis found.  Collaboration of Care: Other None Today   Patient/Guardian was advised Release of Information must be obtained prior to any record release in order to  collaborate their care with an outside provider. Patient/Guardian was advised if they have not already done so to contact the registration department to sign all necessary forms in order for [place] to release information regarding their care.   Consent: Patient/Guardian gives verbal consent for treatment and assignment of benefits for services provided during this visit. Patient/Guardian expressed understanding and agreed to proceed.   Richard Cooks, LCSW 03/03/2023

## 2023-03-24 ENCOUNTER — Ambulatory Visit (HOSPITAL_COMMUNITY): Payer: No Payment, Other | Admitting: Licensed Clinical Social Worker

## 2023-03-28 ENCOUNTER — Other Ambulatory Visit (HOSPITAL_COMMUNITY): Payer: No Payment, Other

## 2023-04-01 ENCOUNTER — Encounter (HOSPITAL_COMMUNITY): Payer: Self-pay | Admitting: Student in an Organized Health Care Education/Training Program

## 2023-04-01 ENCOUNTER — Telehealth (INDEPENDENT_AMBULATORY_CARE_PROVIDER_SITE_OTHER): Payer: No Payment, Other | Admitting: Student in an Organized Health Care Education/Training Program

## 2023-04-01 DIAGNOSIS — F4310 Post-traumatic stress disorder, unspecified: Secondary | ICD-10-CM | POA: Diagnosis not present

## 2023-04-01 DIAGNOSIS — F411 Generalized anxiety disorder: Secondary | ICD-10-CM | POA: Diagnosis not present

## 2023-04-01 DIAGNOSIS — G4700 Insomnia, unspecified: Secondary | ICD-10-CM | POA: Diagnosis not present

## 2023-04-01 DIAGNOSIS — F319 Bipolar disorder, unspecified: Secondary | ICD-10-CM | POA: Diagnosis not present

## 2023-04-01 MED ORDER — TRAZODONE HCL 100 MG PO TABS
100.0000 mg | ORAL_TABLET | Freq: Every day | ORAL | 0 refills | Status: DC
Start: 2023-04-01 — End: 2023-06-15

## 2023-04-01 MED ORDER — ARIPIPRAZOLE 15 MG PO TABS: 15.0000 mg | ORAL_TABLET | Freq: Every day | ORAL | 0 refills | Status: DC

## 2023-04-01 MED ORDER — SERTRALINE HCL 100 MG PO TABS
150.0000 mg | ORAL_TABLET | Freq: Every day | ORAL | 0 refills | Status: DC
Start: 2023-04-01 — End: 2023-06-15

## 2023-04-01 NOTE — Progress Notes (Signed)
Virtual Visit via Video Note  I connected with Richard Roberson on 04/01/23 at  8:00 AM EDT by a video enabled telemedicine application and verified that I am speaking with the correct person using two identifiers.  Location: Patient: Work/ private area Provider: Office   I discussed the limitations of evaluation and management by telemedicine and the availability of in person appointments. The patient expressed understanding and agreed to proceed.     I discussed the assessment and treatment plan with the patient. The patient was provided an opportunity to ask questions and all were answered. The patient agreed with the plan and demonstrated an understanding of the instructions.   The patient was advised to call back or seek an in-person evaluation if the symptoms worsen or if the condition fails to improve as anticipated.  I provided 30 minutes of non-face-to-face time during this encounter.   Bobbye Morton, MD  [facility] MD/PA/NP OP Progress Note  04/01/2023 1:15 PM [name]  MRN:  [id number]  Chief Complaint:  Chief Complaint  Patient presents with   Follow-up   HPI: Richard Roberson is a 32 year old MTF with a past psychiatric history significant for Bipolar d/o, PTSD, generalized anxiety disorder, and insomnia who presents to [facility] via virtual video visit for follow-up and medication management.  Patient is currently being managed on the following medications:   Zoloft 150 mg daily Trazodone 100 mg nightly Abilify 15 mg daily   Patient reports that they are doing much better on current regimen. Patient reports that they are now sleeping 8h a night and very few nightmares. Patient reports that they still have occasional AH, but significantly less. Patient reports that there mood has improved significantly, and a good level of energy where they are now able to do ADLs and live a healthier life. Patient reports that her  last AH was 1.5 weeks ago thought she heard her name being called, by bosses but they weren't nearby. Does think it could be because her name was being called all day. This is a significant improvement from frequently hearing her mother calling his dead name, which is not happening. Patient reports a good appetite, and averaging 3 meals and a snack. Patient reports that work is stable. Patient reports that they feel they are better able to handle stressful moments and occasional low mood. Patient denies significant issues with irritability or anxiety, and feels that they have a much better grasp on emotions. Patient does endorse that they feel like they are also readjusting to being back on estrogen, which she gets weekly 0.39mL on Saturday, he may a bit more tearful. Patient denies SI, HI, and current AVH.   Vaping nicotine it is the vice that keeps him for Etoh as a coping skill.  Etoh- none    Patient is working on finding a new housing situation, but has stable housing for now.   Visit Diagnosis:    ICD-10-CM   1. Bipolar I disorder (HCC)  F31.9 ARIPiprazole (ABILIFY) 15 MG tablet    sertraline (ZOLOFT) 100 MG tablet    2. GAD (generalized anxiety disorder)  F41.1 sertraline (ZOLOFT) 100 MG tablet    3. PTSD (post-traumatic stress disorder)  F43.10 sertraline (ZOLOFT) 100 MG tablet    4. Insomnia, unspecified type  G47.00 traZODone (DESYREL) 100 MG tablet      Past Psychiatric History: Borderline personality disorder Generalized anxiety disorder PTSD Major depressive disorder Insomnia   Hx of Seroquel  Past Medical History:  Past Medical History:  Diagnosis Date   Asthma    as a child   Depression    Suicide (HCC)     Past Surgical History:  Procedure Laterality Date   CHOLECYSTECTOMY     LEG SURGERY Right     Family Psychiatric History:  M GMA and M Aunt: Bipolar   Family History:  Family History  Problem Relation Age of Onset   Diabetes Mother    Heart failure  Mother    Hypertension Mother    Stroke Mother     Social History:  Social History   Socioeconomic History   Marital status: Single    Spouse name: Not on file   Number of children: Not on file   Years of education: Not on file   Highest education level: Not on file  Occupational History   Not on file  Tobacco Use   Smoking status: Former    Types: E-cigarettes   Smokeless tobacco: Former    Types: Associate Professor Use: Every day   Substances: Nicotine, Flavoring  Substance and Sexual Activity   Alcohol use: Yes    Comment: socially   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   Sexual activity: Yes  Other Topics Concern   Not on file  Social History Narrative   Not on file   Social Determinants of Health   Financial Resource Strain: High Risk (02/11/2023)   Overall Financial Resource Strain (CARDIA)    Difficulty of Paying Living Expenses: Hard  Food Insecurity: Food Insecurity Present (02/11/2023)   Hunger Vital Sign    Worried About Running Out of Food in the Last Year: Often true    Ran Out of Food in the Last Year: Often true  Transportation Needs: No Transportation Needs (03/20/2021)   PRAPARE - Administrator, Civil Service (Medical): No    Lack of Transportation (Non-Medical): No  Physical Activity: Insufficiently Active (02/11/2023)   Exercise Vital Sign    Days of Exercise per Week: 2 days    Minutes of Exercise per Session: 60 min  Stress: Stress Concern Present (02/11/2023)   [employer] of Occupational Health - Occupational Stress Questionnaire    Feeling of Stress : To some extent  Social Connections: Moderately Isolated (02/11/2023)   Social Connection and Isolation Panel [NHANES]    Frequency of Communication with Friends and Family: Three times a week    Frequency of Social Gatherings with Friends and Family: Never    Attends Religious Services: Never    Database administrator or Organizations: No    Attends Occupational hygienist Meetings: Never    Marital Status: Living with partner    Allergies:  Allergies  Allergen Reactions   Betadine [Povidone Iodine] Other (See Comments)    Had surgery and incision got infected whiling healing with cast on     Metabolic Disorder Labs: No results found for: "HGBA1C", "MPG" No results found for: "PROLACTIN" No results found for: "CHOL", "TRIG", "HDL", "CHOLHDL", "VLDL", "LDLCALC" No results found for: "TSH"  Therapeutic Level Labs: No results found for: "LITHIUM" No results found for: "VALPROATE" No results found for: "CBMZ"  Current Medications: Current Outpatient Medications  Medication Sig Dispense Refill   albuterol (PROVENTIL HFA;VENTOLIN HFA) 108 (90 Base) MCG/ACT inhaler Inhale 1-2 puffs into the lungs every 6 (six) hours as needed for wheezing or shortness of breath. 1 Inhaler 0  ARIPiprazole (ABILIFY) 15 MG tablet Take 1 tablet (15 mg total) by mouth daily. 90 tablet 0   azithromycin (ZITHROMAX) 250 MG tablet Take 1 tablet (250 mg total) by mouth daily. Take first 2 tablets together, then 1 every day until finished. 6 tablet 0   calcium carbonate (TUMS - DOSED IN MG ELEMENTAL CALCIUM) 500 MG chewable tablet Chew 1-2 tablets by mouth as needed for indigestion or heartburn.     cyclobenzaprine (FLEXERIL) 10 MG tablet Take 1 tablet (10 mg total) by mouth 2 (two) times daily as needed for muscle spasms. 20 tablet 0   ibuprofen (ADVIL,MOTRIN) 200 MG tablet Take 800 mg by mouth every 6 (six) hours as needed for moderate pain.      lidocaine (LIDODERM) 5 % Place 1 patch onto the skin daily. Remove & Discard patch within 12 hours or as directed by MD 15 patch 0   methylPREDNISolone (MEDROL DOSEPAK) 4 MG TBPK tablet Please follow the instructions on the Dosepak to take the steroids. 21 each 0   sertraline (ZOLOFT) 100 MG tablet Take 1.5 tablets (150 mg total) by mouth daily. 135 tablet 0   traZODone (DESYREL) 100 MG tablet Take 1 tablet (100 mg total) by  mouth at bedtime. 90 tablet 0   No current facility-administered medications for this visit.    Psychiatric Specialty Exam: Review of Systems  Psychiatric/Behavioral:  Positive for hallucinations. Negative for dysphoric mood, sleep disturbance and suicidal ideas. The patient is not nervous/anxious.     There were no vitals taken for this visit.There is no height or weight on file to calculate BMI.  General Appearance: Casual  Eye Contact:  Good  Speech:  Clear and Coherent  Volume:  Normal  Mood:  Euthymic  Affect:  Appropriate  Thought Process:  Coherent  Orientation:  Full (Time, Place, and Person)  Thought Content: Logical   Suicidal Thoughts:  No  Homicidal Thoughts:  No  Memory:  Immediate;   Good Recent;   Good  Judgement:  Good  Insight:  Fair  Psychomotor Activity:  Normal  Concentration:  Concentration: Good  Recall:  Good  Fund of Knowledge: Good  Language: Good  Akathisia:  NA  Handed:    AIMS (if indicated): not done  Assets:  Communication Skills Desire for Improvement Housing Leisure Time Resilience Transportation Vocational/Educational  ADL's:  Intact  Cognition: WNL  Sleep:  Good   Screenings: AUDIT    Advertising copywriter from 03/20/2021 in [facility]  Alcohol Use Disorder Identification Test Final Score (AUDIT) 8      GAD-7    Flowsheet Row Counselor from 02/11/2023 in [facility] Counselor from 01/12/2023 in [facility] Counselor from 12/06/2022 in [facility] Counselor from 10/21/2022 in [facility] Counselor from 09/15/2022 in [facility]  Total GAD-7 Score 9 9 15 8 15       PHQ2-9    Flowsheet Row Counselor from 03/03/2023 in [facility] Counselor from 02/11/2023 in [facility] Counselor from 01/12/2023 in [facility] Video Visit from 12/09/2022 in [facility] Counselor from 12/06/2022 in [facility]  PHQ-2 Total Score 2 2 2 5 5   PHQ-9 Total Score 9 11 11 19 20       Flowsheet Row Counselor from 02/11/2023 in [facility] Counselor from 12/06/2022  in [facility] Counselor from 10/21/2022 in [facility]  C-SSRS RISK CATEGORY Low Risk Low Risk Low Risk        Assessment and Plan:Will change patient's MDD dx to Bipolar, based on presentation patient does require a higher dose of Abilify for mood stabilization, and may have had some medication induced hypomania but was on a sizeable maintenance dose of Abilify at the time ands till having symptoms. Patient feels significant improvement and stable on current regimen with good sleep pattern, good energy, and less lability and impulsivity. No med adjustments today.   Discussed with patient pending transition of care to a new resident starting July 1st, and likely discontinuation of care by this provider at that time.   F/u with Dr. Jerrel Ivory in 06/2023  Labs- [facility] given that they had a hard time coming in for weekday appt due to work.    Bipolar disorder Hx GAD PTSD Hx gender dysphoria-resolved currently in MTF, continuing hormone therapy - continue Zoloft 150mg  - Continue Abilify to 15 mg daily - Continue trazodone 100 mg nightly Labs: CBC, CMP, TSH, A1c, lipids and EKG  Collaboration of Care: Collaboration of Care:   Patient/Guardian was advised Release of Information must be obtained prior to any record release in order to collaborate their care with an outside provider. Patient/Guardian was advised if they have not already done so to contact the registration department to sign all necessary forms in order for [place] to release  information regarding their care.   Consent: Patient/Guardian gives verbal consent for treatment and assignment of benefits for services provided during this visit. Patient/Guardian expressed understanding and agreed to proceed.   PGY-3 Bobbye Morton, MD 04/01/2023, 1:15 PM

## 2023-04-01 NOTE — Patient Instructions (Signed)
   Requested labs:  CBC CMP/ CMET TSH A1c Lipids If possible an EKG as well   All of the above items are needed at least yearly when on medications like Abilify.  Fax Number [phone]  Thank you.

## 2023-05-02 ENCOUNTER — Ambulatory Visit (INDEPENDENT_AMBULATORY_CARE_PROVIDER_SITE_OTHER): Payer: No Payment, Other | Admitting: Licensed Clinical Social Worker

## 2023-05-02 DIAGNOSIS — F640 Transsexualism: Secondary | ICD-10-CM

## 2023-05-02 DIAGNOSIS — F331 Major depressive disorder, recurrent, moderate: Secondary | ICD-10-CM

## 2023-05-02 DIAGNOSIS — F411 Generalized anxiety disorder: Secondary | ICD-10-CM | POA: Diagnosis not present
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
## 2023-06-02 ENCOUNTER — Ambulatory Visit (HOSPITAL_COMMUNITY): Payer: No Payment, Other | Admitting: Licensed Clinical Social Worker

## 2023-06-02 ENCOUNTER — Encounter (HOSPITAL_COMMUNITY): Payer: Self-pay

## 2023-06-08 ENCOUNTER — Ambulatory Visit (HOSPITAL_COMMUNITY): Payer: No Payment, Other | Admitting: Licensed Clinical Social Worker

## 2023-06-08 DIAGNOSIS — F331 Major depressive disorder, recurrent, moderate: Secondary | ICD-10-CM

## 2023-06-08 DIAGNOSIS — F4310 Post-traumatic stress disorder, unspecified: Secondary | ICD-10-CM

## 2023-06-08 DIAGNOSIS — F411 Generalized anxiety disorder: Secondary | ICD-10-CM

## 2023-06-08 NOTE — Progress Notes (Signed)
THERAPIST PROGRESS NOTE  Virtual Visit via Video Note  I connected with Richard Roberson on 06/08/23 at 10:00 AM EDT by a video enabled telemedicine application and verified that I am speaking with the correct person using two identifiers.  Location: Patient: Richard Roberson  Provider: Providers Home    I discussed the limitations of evaluation and management by telemedicine and the availability of in person appointments. The patient expressed understanding and agreed to proceed.     I discussed the assessment and treatment plan with the patient. The patient was provided an opportunity to ask questions and all were answered. The patient agreed with the plan and demonstrated an understanding of the instructions.   The patient was advised to call back or seek an in-person evaluation if the symptoms worsen or if the condition fails to improve as anticipated.  I provided 30 minutes of non-face-to-face time during this encounter.   Richard Cooks, LCSW   Participation Level: Active  Behavioral Response: CasualAlertAnxious and Depressed  Type of Therapy: Individual Therapy  Treatment Goals addressed:  Active     Anxiety Disorder CCP Problem  1 GAD      walk 3 x weekly (Progressing)     Start:  12/14/21    Expected End:  09/10/23       Goal Note     Self reports walking 2 x weekly          Identify three trigger causing anxiety  (Progressing)     Start:  12/14/21    Expected End:  09/10/23       Goal Note     Ex relationship          LTG: Patient will score less than 5 on the Generalized Anxiety Disorder 7 Scale (GAD-7) (Progressing)     Start:  12/14/21    Expected End:  09/10/23       Goal Note     Pt scored same as last month/ 4 weeks ago (9)          STG: Patient will attend at least 80% of scheduled group psychotherapy sessions (Progressing)     Start:  12/14/21    Expected End:  09/10/23         STG: Patient will practice problem solving  skills 3 times per week for the next 4 weeks (Progressing)     Start:  12/14/21    Expected End:  09/10/23         Discuss risks and benefits of medication treatment options for this problem and prescribe as indicated (Completed)     Start:  12/14/21    End:  02/18/22      Encourage patient to take psychotropic medication as prescribed (Completed)     Start:  12/14/21    End:  02/18/22      Review results of GAD-7 with the patient to track progress (Completed)     Start:  12/14/21    End:  04/15/22      Work with patient to track symptoms, triggers and/or skill use through a mood chart, diary card, or journal (Completed)     Start:  12/14/21    End:  02/18/22      Perform psychoeducation regarding anxiety disorders (Completed)     Start:  12/14/21    End:  02/18/22        Depression CCP Problem  1 MDD     identify three trigger causing depression.  (Progressing)  Start:  12/14/21    Expected End:  09/10/23       Goal Note     Grief/loss of grandmother          LTG: Richard Roberson WILL SCORE LESS THAN 10 ON THE PATIENT HEALTH QUESTIONNAIRE (PHQ-9) (Progressing)     Start:  12/14/21    Expected End:  09/10/23       Goal Note     Went from an 11 to a 9             ProgressTowards Goals: Progressing  Interventions: CBT and Motivational Interviewing  Summary: Richard Roberson is a 32 y.o. adult who presents with  epressed and anxious mood\affect.  Patient was pleasant, cooperative, maintained good eye contact.  He engaged well in therapy session was dressed casually.  Patient reports symptoms for nightmares, tension, worry, and restlessness.  Patient reports stressors for financials and work.  Patient reports still processing through his break-ups for more than a year ago.  LCSW administered the GAD-7.  LCSW administered a PHQ-9.  LCSW reviewed scores with patient.  LCSW supportive therapy for praise and encouragement.  LCSW psycho analytic therapy for  patient to express thoughts, feelings and emotions in session.  Patient also endorses symptoms for insomnia.  LCSW educated patient on advocating for himself with medication management to possibly get a higher dosage for sleeping medication.  LCSW reviewed things that help aid sleep such as meditation and journaling.  This is for patient to externalize thoughts and feelings rather than internalize them prior to going to bed.  Suicidal/Homicidal: Nowithout intent/plan  Plan: Return again in 3 weeks.  Diagnosis: Moderate episode of recurrent major depressive disorder (HCC)  GAD (generalized anxiety disorder)  PTSD (post-traumatic stress disorder)  Collaboration of Care: Other None today   Patient/Guardian was advised Release of Information must be obtained prior to any record release in order to collaborate their care with an outside provider. Patient/Guardian was advised if they have not already done so to contact the registration department to sign all necessary forms in order for [place] to release information regarding their care.   Consent: Patient/Guardian gives verbal consent for treatment and assignment of benefits for services provided during this visit. Patient/Guardian expressed understanding and agreed to proceed.   Richard Cooks, LCSW 06/08/2023

## 2023-06-14 ENCOUNTER — Telehealth (HOSPITAL_COMMUNITY): Payer: Self-pay | Admitting: Student in an Organized Health Care Education/Training Program

## 2023-06-15 ENCOUNTER — Other Ambulatory Visit (HOSPITAL_COMMUNITY): Payer: Self-pay | Admitting: Student

## 2023-06-15 DIAGNOSIS — G4700 Insomnia, unspecified: Secondary | ICD-10-CM

## 2023-06-15 DIAGNOSIS — F319 Bipolar disorder, unspecified: Secondary | ICD-10-CM

## 2023-06-15 DIAGNOSIS — F4310 Post-traumatic stress disorder, unspecified: Secondary | ICD-10-CM

## 2023-06-15 DIAGNOSIS — F411 Generalized anxiety disorder: Secondary | ICD-10-CM

## 2023-06-15 MED ORDER — SERTRALINE HCL 100 MG PO TABS
150.0000 mg | ORAL_TABLET | Freq: Every day | ORAL | 0 refills | Status: AC
Start: 2023-06-15 — End: 2023-09-13

## 2023-06-15 MED ORDER — ARIPIPRAZOLE 15 MG PO TABS
15.0000 mg | ORAL_TABLET | Freq: Every day | ORAL | 0 refills | Status: AC
Start: 2023-06-15 — End: ?

## 2023-06-15 MED ORDER — TRAZODONE HCL 100 MG PO TABS
100.0000 mg | ORAL_TABLET | Freq: Every day | ORAL | 0 refills | Status: AC
Start: 2023-06-15 — End: ?

## 2023-06-17 ENCOUNTER — Encounter (HOSPITAL_COMMUNITY): Payer: No Payment, Other | Admitting: Student

## 2023-07-05 ENCOUNTER — Ambulatory Visit (HOSPITAL_COMMUNITY): Payer: No Payment, Other | Admitting: Licensed Clinical Social Worker

## 2023-07-05 DIAGNOSIS — F331 Major depressive disorder, recurrent, moderate: Secondary | ICD-10-CM

## 2023-07-05 DIAGNOSIS — F411 Generalized anxiety disorder: Secondary | ICD-10-CM

## 2023-07-05 NOTE — Progress Notes (Unsigned)
THERAPIST PROGRESS NOTE  Virtual Visit via Video Note  I connected with MUCAAD SCHIERMAN on 07/05/23 at  4:00 PM EDT by a video enabled telemedicine application and verified that I am speaking with the correct person using two identifiers.  Location: Patient: [facility]  Provider: [facility]iders Home    I discussed the limitations of evaluation and management by telemedicine and the availability of in person appointments. The patient expressed understanding and agreed to proceed.     I discussed the assessment and treatment plan with the patient. The patient was provided an opportunity to ask questions and all were answered. The patient agreed with the plan and demonstrated an understanding of the instructions.   The patient was advised to call back or seek an in-person evaluation if the symptoms worsen or if the condition fails to improve as anticipated.  I provided 30 minutes of non-face-to-face time during this encounter.   [name], LCSW   Participation Level: Active  Behavioral Response: CasualAlertAnxious and Depressed  Type of Therapy: Individual Therapy  Treatment Goals addressed:  Active     Anxiety Disorder CCP Problem  1 GAD      walk 3 x weekly (Progressing)     Start:  12/14/21    Expected End:  09/10/23       Goal Note     Self reports walking 2 x weekly          Identify three trigger causing anxiety  (Progressing)     Start:  12/14/21    Expected End:  09/10/23       Goal Note     Housing          LTG: Patient will score less than 5 on the Generalized Anxiety Disorder 7 Scale (GAD-7) (Progressing)     Start:  12/14/21    Expected End:  09/10/23       Goal Note     Pt scored same as last month/ 4 weeks ago (9)          STG: Patient will attend at least 80% of scheduled group psychotherapy sessions (Progressing)     Start:  12/14/21    Expected End:  09/10/23         STG: Patient will practice problem solving skills  3 times per week for the next 4 weeks (Progressing)     Start:  12/14/21    Expected End:  09/10/23         Discuss risks and benefits of medication treatment options for this problem and prescribe as indicated (Completed)     Start:  12/14/21    End:  02/18/22      Encourage patient to take psychotropic medication as prescribed (Completed)     Start:  12/14/21    End:  02/18/22      Review results of GAD-7 with the patient to track progress (Completed)     Start:  12/14/21    End:  04/15/22      Work with patient to track symptoms, triggers and/or skill use through a mood chart, diary card, or journal (Completed)     Start:  12/14/21    End:  02/18/22      Perform psychoeducation regarding anxiety disorders (Completed)     Start:  12/14/21    End:  02/18/22        Depression CCP Problem  1 MDD     identify three trigger causing depression.  (Progressing)  Start:  12/14/21    Expected End:  09/10/23       Goal Note     Housing          LTG: [place] WILL SCORE LESS THAN 10 ON THE PATIENT HEALTH QUESTIONNAIRE (PHQ-9) (Progressing)     Start:  12/14/21    Expected End:  09/10/23       Goal Note     Went from an 11 to a 9             ProgressTowards Goals: Progressing  Interventions: CBT, Motivational Interviewing, and Supportive  Suicidal/Homicidal: Nowithout intent/plan  Therapist Response:       Plan: Return again in 3 weeks.  Diagnosis: GAD (generalized anxiety disorder)  Moderate episode of recurrent major depressive disorder (HCC)  Collaboration of Care: Other None today   Patient/Guardian was advised Release of Information must be obtained prior to any record release in order to collaborate their care with an outside provider. Patient/Guardian was advised if they have not already done so to contact the registration department to sign all necessary forms in order for [place] to release information regarding their care.   Consent:  Patient/Guardian gives verbal consent for treatment and assignment of benefits for services provided during this visit. Patient/Guardian expressed understanding and agreed to proceed.   [name], LCSW 07/05/2023

## 2023-07-12 ENCOUNTER — Encounter (HOSPITAL_COMMUNITY): Payer: No Payment, Other | Admitting: Student

## 2023-07-12 NOTE — Progress Notes (Deleted)
BH MD Outpatient Progress Note  07/12/2023 7:45 AM Richard Roberson  MRN:  [id number]  Assessment:  Richard Roberson presents for follow-up evaluation. Today, patient reports ***  Identifying Information: Richard Roberson is a 32 y.o. y.o. adult with a history of *** who is an established patient with [facility] for management of ***.   Plan:  # *** Interventions: -- ***  Patient was given contact information for behavioral health clinic and was instructed to call 911 for emergencies.   Subjective:  Chief Complaint: No chief complaint on file.   Interval History:   CR 32 yo, Trans male [facility] last 6/21, doing well, mood improved, working, Dx is BPAD 1, Hx BPD? Zol 150, Traz 100, Ab 15 - no changes made Labs not obtained Seeing Adam  PHQ-9 BPD? vs BPAD  Plan Labs, PCP EKG Adam  Visit Diagnosis: No diagnosis found.  Past Psychiatric History: ***  Past Medical History:  Past Medical History:  Diagnosis Date   Asthma    as a child   Depression    Suicide (HCC)     Past Surgical History:  Procedure Laterality Date   CHOLECYSTECTOMY     LEG SURGERY Right     Family Psychiatric History: ***  Family History:  Family History  Problem Relation Age of Onset   Diabetes Mother    Heart failure Mother    Hypertension Mother    Stroke Mother     Social History:  Social History   Socioeconomic History   Marital status: Single    Spouse name: Not on file   Number of children: Not on file   Years of education: Not on file   Highest education level: Not on file  Occupational History   Not on file  Tobacco Use   Smoking status: Former    Types: E-cigarettes   Smokeless tobacco: Former    Types: Associate Professor status: Every Day   Substances: Nicotine, Flavoring  Substance and Sexual Activity   Alcohol use: Yes    Comment: socially   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   Sexual activity:  Yes  Other Topics Concern   Not on file  Social History Narrative   Not on file   Social Determinants of Health   Financial Resource Strain: High Risk (02/11/2023)   Overall Financial Resource Strain (CARDIA)    Difficulty of Paying Living Expenses: Hard  Food Insecurity: Food Insecurity Present (02/11/2023)   Hunger Vital Sign    Worried About Running Out of Food in the Last Year: Often true    Ran Out of Food in the Last Year: Often true  Transportation Needs: No Transportation Needs (03/20/2021)   PRAPARE - Administrator, Civil Service (Medical): No    Lack of Transportation (Non-Medical): No  Physical Activity: Insufficiently Active (02/11/2023)   Exercise Vital Sign    Days of Exercise per Week: 2 days    Minutes of Exercise per Session: 60 min  Stress: Stress Concern Present (02/11/2023)   [employer] of Occupational Health - Occupational Stress Questionnaire    Feeling of Stress : To some extent  Social Connections: Moderately Isolated (02/11/2023)   Social Connection and Isolation Panel [NHANES]    Frequency of Communication with Friends and Family: Three times a week    Frequency of Social Gatherings with Friends and Family: Never    Attends Religious Services:  Never    Active Member of Clubs or Organizations: No    Attends Banker Meetings: Never    Marital Status: Living with partner    Allergies:  Allergies  Allergen Reactions   Betadine [Povidone Iodine] Other (See Comments)    Had surgery and incision got infected whiling healing with cast on     Current Medications: Current Outpatient Medications  Medication Sig Dispense Refill   albuterol (PROVENTIL HFA;VENTOLIN HFA) 108 (90 Base) MCG/ACT inhaler Inhale 1-2 puffs into the lungs every 6 (six) hours as needed for wheezing or shortness of breath. 1 Inhaler 0   ARIPiprazole (ABILIFY) 15 MG tablet Take 1 tablet (15 mg total) by mouth daily. 90 tablet 0   azithromycin (ZITHROMAX) 250 MG  tablet Take 1 tablet (250 mg total) by mouth daily. Take first 2 tablets together, then 1 every day until finished. 6 tablet 0   calcium carbonate (TUMS - DOSED IN MG ELEMENTAL CALCIUM) 500 MG chewable tablet Chew 1-2 tablets by mouth as needed for indigestion or heartburn.     cyclobenzaprine (FLEXERIL) 10 MG tablet Take 1 tablet (10 mg total) by mouth 2 (two) times daily as needed for muscle spasms. 20 tablet 0   ibuprofen (ADVIL,MOTRIN) 200 MG tablet Take 800 mg by mouth every 6 (six) hours as needed for moderate pain.      lidocaine (LIDODERM) 5 % Place 1 patch onto the skin daily. Remove & Discard patch within 12 hours or as directed by MD 15 patch 0   methylPREDNISolone (MEDROL DOSEPAK) 4 MG TBPK tablet Please follow the instructions on the Dosepak to take the steroids. 21 each 0   sertraline (ZOLOFT) 100 MG tablet Take 1.5 tablets (150 mg total) by mouth daily. 135 tablet 0   traZODone (DESYREL) 100 MG tablet Take 1 tablet (100 mg total) by mouth at bedtime. 90 tablet 0   No current facility-administered medications for this visit.     Objective:  Psychiatric Specialty Exam: Physical Exam Constitutional:      Appearance: the patient is not toxic-appearing.  Pulmonary:     Effort: Pulmonary effort is normal.  Neurological:     General: No focal deficit present.     Mental Status: the patient is alert and oriented to person, place, and time.   Review of Systems  Respiratory:  Negative for shortness of breath.   Cardiovascular:  Negative for chest pain.  Gastrointestinal:  Negative for abdominal pain, constipation, diarrhea, nausea and vomiting.  Neurological:  Negative for headaches.      There were no vitals taken for this visit.  General Appearance: Fairly Groomed  Eye Contact:  Good  Speech:  Clear and Coherent  Volume:  Normal  Mood:  Euthymic  Affect:  Congruent  Thought Process:  Coherent  Orientation:  Full (Time, Place, and Person)  Thought Content: Logical    Suicidal Thoughts:  No  Homicidal Thoughts:  No  Memory:  Immediate;   Good  Judgement:  fair  Insight:  fair  Psychomotor Activity:  Normal  Concentration:  Concentration: Good  Recall:  Good  Fund of Knowledge: Good  Language: Good  Akathisia:  No  Handed:    AIMS (if indicated): not done  Assets:  Communication Skills Desire for Improvement Financial Resources/Insurance Housing Leisure Time Physical Health  ADL's:  Intact  Cognition: WNL  Sleep:  Fair     Metabolic Disorder Labs: No results found for: "HGBA1C", "MPG" No results found for: "PROLACTIN" No  results found for: "CHOL", "TRIG", "HDL", "CHOLHDL", "VLDL", "LDLCALC" No results found for: "TSH"  Therapeutic Level Labs: No results found for: "LITHIUM" No results found for: "VALPROATE" No results found for: "CBMZ"  Screenings: AUDIT    Flowsheet Row Counselor from 03/20/2021 in [facility]  Alcohol Use Disorder Identification Test Final Score (AUDIT) 8      GAD-7    Flowsheet Row Counselor from 06/08/2023 in [facility] Counselor from 02/11/2023 in [facility] Counselor from 01/12/2023 in [facility] Counselor from 12/06/2022 in [facility] Counselor from 10/21/2022 in [facility]  Total GAD-7 Score 11 9 9 15 8       PHQ2-9    Flowsheet Row Counselor from 06/08/2023 in [facility] Counselor from 03/03/2023 in [facility] Counselor from 02/11/2023 in [facility] Counselor from 01/12/2023 in [facility] Video Visit from 12/09/2022 in [facility]  PHQ-2 Total Score 3 2 2 2 5   PHQ-9 Total Score 10 9 11 11 19       Flowsheet Row Counselor from 02/11/2023 in [facility] Counselor  from 12/06/2022 in [facility] Counselor from 10/21/2022 in [facility]  C-SSRS RISK CATEGORY Low Risk Low Risk Low Risk       Collaboration of Care: none  A total of 30 minutes was spent involved in face to face clinical care, chart review, documentation.   Carlyn Reichert, MD 07/12/2023, 7:45 AM

## 2023-08-02 ENCOUNTER — Ambulatory Visit (INDEPENDENT_AMBULATORY_CARE_PROVIDER_SITE_OTHER): Payer: No Payment, Other | Admitting: Licensed Clinical Social Worker

## 2023-08-02 DIAGNOSIS — F4310 Post-traumatic stress disorder, unspecified: Secondary | ICD-10-CM

## 2023-08-02 DIAGNOSIS — F331 Major depressive disorder, recurrent, moderate: Secondary | ICD-10-CM | POA: Diagnosis not present

## 2023-08-02 DIAGNOSIS — F640 Transsexualism: Secondary | ICD-10-CM

## 2023-08-02 NOTE — Progress Notes (Unsigned)
THERAPIST PROGRESS NOTE  Virtual Visit via Video Note  I connected with Richard Roberson on 08/02/23 at  4:00 PM EDT by a video enabled telemedicine application and verified that I am speaking with the correct person using two identifiers.  Location: Patient: [facility]  Provider: [facility]iders Home    I discussed the limitations of evaluation and management by telemedicine and the availability of in person appointments. The patient expressed understanding and agreed to proceed.    I discussed the assessment and treatment plan with the patient. The patient was provided an opportunity to ask questions and all were answered. The patient agreed with the plan and demonstrated an understanding of the instructions.   The patient was advised to call back or seek an in-person evaluation if the symptoms worsen or if the condition fails to improve as anticipated.  I provided 45 minutes of non-face-to-face time during this encounter.  [name], LCSW   Participation Level: Active  Behavioral Response: CasualAlertAnxious and Depressed  Type of Therapy: Individual Therapy  Treatment Goals addressed:  Active     Anxiety Disorder CCP Problem  1 GAD      walk 3 x weekly (Completed/Met)     Start:  12/14/21    Expected End:  09/10/23    Resolved:  08/02/23    Goal Note     Pt self reports walking 3 x weekly          Identify three trigger causing anxiety  (Completed/Met)     Start:  12/14/21    Expected End:  09/10/23    Resolved:  08/02/23    Goal Note     Housing  Partner  Financial          LTG: Patient will score less than 5 on the Generalized Anxiety Disorder 7 Scale (GAD-7) (Progressing)     Start:  12/14/21    Expected End:  09/10/23       Goal Note     Pt scored same as last month/ 4 weeks ago (9)          STG: Patient will attend at least 80% of scheduled group psychotherapy sessions (Completed/Met)     Start:  12/14/21    Expected End:   09/10/23    Resolved:  08/02/23    Goal Note     Today is last session do to pt moving outside [employer] and per chart review She has done 80 percent attendance          STG: Patient will practice problem solving skills 3 times per week for the next 4 weeks (Progressing)     Start:  12/14/21    Expected End:  09/10/23         Discuss risks and benefits of medication treatment options for this problem and prescribe as indicated (Completed)     Start:  12/14/21    End:  02/18/22      Encourage patient to take psychotropic medication as prescribed (Completed)     Start:  12/14/21    End:  02/18/22      Review results of GAD-7 with the patient to track progress (Completed)     Start:  12/14/21    End:  04/15/22      Work with patient to track symptoms, triggers and/or skill use through a mood chart, diary card, or journal (Completed)     Start:  12/14/21    End:  02/18/22  Perform psychoeducation regarding anxiety disorders (Completed)     Start:  12/14/21    End:  02/18/22        Depression CCP Problem  1 MDD     identify three trigger causing depression.  (Progressing)     Start:  12/14/21    Expected End:  09/10/23       Goal Note     Feelings of self doubt.          LTG: [name] WILL SCORE LESS THAN 10 ON THE PATIENT HEALTH QUESTIONNAIRE (PHQ-9) (Progressing)     Start:  12/14/21    Expected End:  09/10/23       Goal Note     Went from an 11 to a 9             ProgressTowards Goals: Progressing  Interventions: CBT, Motivational Interviewing, and Supportive   Suicidal/Homicidal: Nowithout intent/plan  Therapist Response:     Patient comes in today with depressed and anxious mood\affect.  Patient was pleasant, cooperative, maintained good eye contact.  She engaged well in therapy session was dressed casually.  Patient was alert and oriented x 5.  Patient comes in today after moving to [facility].  LCSW reindicated to  patient that this would be their last session together as patient has moved outside to [facility].  Patient was agreeable to last session.  Patient reports primary stressor as advocating for herself.  Patient reports that she has a partner and is struggling with communication with her.  Patient report reports that she feels like a burden if she does advocate for herself. [name] states that this may be due to stress of the move and starting a new job next week.  Patient denies any suicidal or homicidal ideations.  Patient denies any auditory or visual hallucinations.  Interventions/Plan: LCSW utilized psycho analytic therapy for patient to express thoughts, feelings and emotions in session and nonjudgmental environment.  LCSW supportive therapy for praise and encouragement.  LCSW educated patient on communication skills such as nonverbal's, tone of voice, and honest communication.  LCSW educated patient on advocating for self not just in partnership but also with her mental health as patient should continue treatment for medication management and counseling and new city.  Plan: Return again in 3 weeks.  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 Patient/Guardian was advised if they have not already done so to contact the registration department to sign all necessary forms in order for [place] to release information regarding their care.   Consent: Patient/Guardian gives verbal consent for treatment and assignment of benefits for services provided during this visit. Patient/Guardian expressed understanding and agreed to proceed.   [name], LCSW 08/02/2023
